# Patient Record
Sex: Male | Born: 1965
Health system: Southern US, Community
[De-identification: ages and names within clinical notes are randomized; demographics above are authoritative.]

## PROBLEM LIST (undated history)

## (undated) DIAGNOSIS — Z86711 Personal history of pulmonary embolism: Secondary | ICD-10-CM

## (undated) DIAGNOSIS — K649 Unspecified hemorrhoids: Secondary | ICD-10-CM

## (undated) DIAGNOSIS — Z9889 Other specified postprocedural states: Secondary | ICD-10-CM

## (undated) DIAGNOSIS — I82409 Acute embolism and thrombosis of unspecified deep veins of unspecified lower extremity: Secondary | ICD-10-CM

## (undated) HISTORY — PX: SHOULDER SURGERY: SHX246

## (undated) HISTORY — DX: Other specified postprocedural states: Z98.890

## (undated) HISTORY — DX: Unspecified hemorrhoids: K64.9

## (undated) HISTORY — DX: Acute embolism and thrombosis of unspecified deep veins of unspecified lower extremity: I82.409

---
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
## 1998-09-03 ENCOUNTER — Emergency Department (HOSPITAL_COMMUNITY): Admission: EM | Admit: 1998-09-03 | Discharge: 1998-09-03 | Payer: Self-pay | Admitting: *Deleted

## 1998-09-06 ENCOUNTER — Emergency Department (HOSPITAL_COMMUNITY): Admission: EM | Admit: 1998-09-06 | Discharge: 1998-09-06 | Payer: Self-pay | Admitting: Emergency Medicine

## 1999-04-29 ENCOUNTER — Emergency Department (HOSPITAL_COMMUNITY): Admission: EM | Admit: 1999-04-29 | Discharge: 1999-04-29 | Payer: Self-pay | Admitting: Emergency Medicine

## 1999-06-24 ENCOUNTER — Encounter: Payer: Self-pay | Admitting: Emergency Medicine

## 1999-06-24 ENCOUNTER — Emergency Department (HOSPITAL_COMMUNITY): Admission: EM | Admit: 1999-06-24 | Discharge: 1999-06-24 | Payer: Self-pay | Admitting: Emergency Medicine

## 1999-06-27 ENCOUNTER — Emergency Department (HOSPITAL_COMMUNITY): Admission: EM | Admit: 1999-06-27 | Discharge: 1999-06-27 | Payer: Self-pay | Admitting: Emergency Medicine

## 1999-07-02 ENCOUNTER — Emergency Department (HOSPITAL_COMMUNITY): Admission: EM | Admit: 1999-07-02 | Discharge: 1999-07-02 | Payer: Self-pay | Admitting: Emergency Medicine

## 2002-08-20 ENCOUNTER — Emergency Department (HOSPITAL_COMMUNITY): Admission: EM | Admit: 2002-08-20 | Discharge: 2002-08-20 | Payer: Self-pay | Admitting: *Deleted

## 2003-02-26 ENCOUNTER — Emergency Department (HOSPITAL_COMMUNITY): Admission: EM | Admit: 2003-02-26 | Discharge: 2003-02-27 | Payer: Self-pay | Admitting: Emergency Medicine

## 2003-02-26 ENCOUNTER — Encounter: Payer: Self-pay | Admitting: *Deleted

## 2004-01-08 ENCOUNTER — Emergency Department (HOSPITAL_COMMUNITY): Admission: EM | Admit: 2004-01-08 | Discharge: 2004-01-08 | Payer: Self-pay | Admitting: Emergency Medicine

## 2004-01-09 ENCOUNTER — Ambulatory Visit (HOSPITAL_COMMUNITY): Admission: RE | Admit: 2004-01-09 | Discharge: 2004-01-09 | Payer: Self-pay | Admitting: Emergency Medicine

## 2005-05-11 ENCOUNTER — Emergency Department (HOSPITAL_COMMUNITY): Admission: EM | Admit: 2005-05-11 | Discharge: 2005-05-11 | Payer: Self-pay | Admitting: Emergency Medicine

## 2006-02-09 ENCOUNTER — Emergency Department (HOSPITAL_COMMUNITY): Admission: EM | Admit: 2006-02-09 | Discharge: 2006-02-09 | Payer: Self-pay | Admitting: Family Medicine

## 2006-12-04 ENCOUNTER — Emergency Department (HOSPITAL_COMMUNITY): Admission: EM | Admit: 2006-12-04 | Discharge: 2006-12-04 | Payer: Self-pay | Admitting: Emergency Medicine

## 2007-01-01 ENCOUNTER — Emergency Department (HOSPITAL_COMMUNITY): Admission: EM | Admit: 2007-01-01 | Discharge: 2007-01-01 | Payer: Self-pay | Admitting: Family Medicine

## 2007-08-07 ENCOUNTER — Emergency Department (HOSPITAL_COMMUNITY): Admission: EM | Admit: 2007-08-07 | Discharge: 2007-08-07 | Payer: Self-pay | Admitting: Emergency Medicine

## 2008-02-16 ENCOUNTER — Encounter (INDEPENDENT_AMBULATORY_CARE_PROVIDER_SITE_OTHER): Payer: Self-pay | Admitting: Nurse Practitioner

## 2008-02-16 ENCOUNTER — Ambulatory Visit: Payer: Self-pay | Admitting: *Deleted

## 2008-02-16 ENCOUNTER — Ambulatory Visit: Payer: Self-pay | Admitting: Internal Medicine

## 2008-02-16 LAB — CONVERTED CEMR LAB
AST: 30 units/L (ref 0–37)
Albumin: 4.2 g/dL (ref 3.5–5.2)
Alkaline Phosphatase: 55 units/L (ref 39–117)
BUN: 13 mg/dL (ref 6–23)
Basophils Absolute: 0 10*3/uL (ref 0.0–0.1)
Basophils Relative: 1 % (ref 0–1)
CO2: 24 meq/L (ref 19–32)
Chloride: 104 meq/L (ref 96–112)
HCT: 48.4 % (ref 39.0–52.0)
HDL: 48 mg/dL (ref >39)
Hemoglobin: 16.2 g/dL (ref 13.0–17.0)
LDL Cholesterol: 129 mg/dL — ABNORMAL HIGH (ref 0–99)
Lymphs Abs: 1.6 10*3/uL (ref 0.7–4.0)
MCV: 94.3 fL (ref 78.0–100.0)
Monocytes Absolute: 0.6 10*3/uL (ref 0.1–1.0)
Monocytes Relative: 10 % (ref 3–12)
Platelets: 202 10*3/uL (ref 150–400)
Potassium: 4.5 meq/L (ref 3.5–5.3)
TSH: 1.259 microintl units/mL (ref 0.350–5.50)
Total CHOL/HDL Ratio: 4.1
Triglycerides: 98 mg/dL (ref <150)
WBC: 5.7 10*3/uL (ref 4.0–10.5)

## 2008-02-17 ENCOUNTER — Ambulatory Visit (HOSPITAL_COMMUNITY): Admission: RE | Admit: 2008-02-17 | Discharge: 2008-02-17 | Payer: Self-pay | Admitting: Internal Medicine

## 2008-09-02 ENCOUNTER — Emergency Department (HOSPITAL_COMMUNITY): Admission: EM | Admit: 2008-09-02 | Discharge: 2008-09-03 | Payer: Self-pay | Admitting: Emergency Medicine

## 2010-10-31 ENCOUNTER — Emergency Department (HOSPITAL_COMMUNITY): Admission: EM | Admit: 2010-10-31 | Discharge: 2010-01-03 | Payer: Self-pay | Admitting: Emergency Medicine

## 2011-02-12 LAB — URINALYSIS, ROUTINE W REFLEX MICROSCOPIC
Nitrite: NEGATIVE
Specific Gravity, Urine: 1.01 (ref 1.005–1.030)

## 2011-09-05 LAB — I-STAT 8, (EC8 V) (CONVERTED LAB)
BUN: 10
Bicarbonate: 21.4
Chloride: 109
Glucose, Bld: 79
Hemoglobin: 17.7 — ABNORMAL HIGH
pCO2, Ven: 39.1 — ABNORMAL LOW
pH, Ven: 7.347 — ABNORMAL HIGH

## 2011-09-05 LAB — CBC
HCT: 47.3
Platelets: 230
RBC: 5.13
WBC: 6.7

## 2011-09-05 LAB — DIFFERENTIAL
Lymphocytes Relative: 29
Lymphs Abs: 1.9
Monocytes Relative: 8
Neutro Abs: 4.2

## 2011-09-05 LAB — SAMPLE TO BLOOD BANK

## 2011-09-05 LAB — POCT I-STAT CREATININE
Creatinine, Ser: 1.1
Operator id: 277751

## 2015-07-29 ENCOUNTER — Emergency Department (HOSPITAL_COMMUNITY)
Admission: EM | Admit: 2015-07-29 | Discharge: 2015-07-29 | Disposition: A | Discharge status: Home / Self Care | Payer: Self-pay | Attending: Emergency Medicine | Admitting: Emergency Medicine

## 2015-07-29 ENCOUNTER — Encounter (HOSPITAL_COMMUNITY): Payer: Self-pay | Admitting: Emergency Medicine

## 2015-07-29 DIAGNOSIS — R42 Dizziness and giddiness: Secondary | ICD-10-CM | POA: Insufficient documentation

## 2015-07-29 DIAGNOSIS — R1033 Periumbilical pain: Secondary | ICD-10-CM | POA: Insufficient documentation

## 2015-07-29 DIAGNOSIS — R109 Unspecified abdominal pain: Secondary | ICD-10-CM

## 2015-07-29 DIAGNOSIS — R197 Diarrhea, unspecified: Secondary | ICD-10-CM | POA: Insufficient documentation

## 2015-07-29 LAB — URINALYSIS, ROUTINE W REFLEX MICROSCOPIC
Bilirubin Urine: NEGATIVE
Glucose, UA: NEGATIVE mg/dL
Ketones, ur: 15 mg/dL — AB
LEUKOCYTES UA: NEGATIVE
NITRITE: NEGATIVE
PH: 5.5 (ref 5.0–8.0)
Protein, ur: 30 mg/dL — AB
SPECIFIC GRAVITY, URINE: 1.024 (ref 1.005–1.030)
Urobilinogen, UA: 0.2 mg/dL (ref 0.0–1.0)

## 2015-07-29 LAB — URINE MICROSCOPIC-ADD ON

## 2015-07-29 LAB — I-STAT CHEM 8, ED
BUN: 8 mg/dL (ref 6–20)
CHLORIDE: 102 mmol/L (ref 101–111)
CREATININE: 1.2 mg/dL (ref 0.61–1.24)
Calcium, Ion: 1.09 mmol/L — ABNORMAL LOW (ref 1.12–1.23)
Glucose, Bld: 134 mg/dL — ABNORMAL HIGH (ref 65–99)
HEMATOCRIT: 55 % — AB (ref 39.0–52.0)
HEMOGLOBIN: 18.7 g/dL — AB (ref 13.0–17.0)
POTASSIUM: 3.6 mmol/L (ref 3.5–5.1)
Sodium: 136 mmol/L (ref 135–145)
TCO2: 21 mmol/L (ref 0–100)

## 2015-07-29 MED ORDER — DICYCLOMINE HCL 20 MG PO TABS: 20.0000 mg | ORAL_TABLET | Freq: Three times a day (TID) | ORAL | Status: DC

## 2015-07-29 MED ORDER — DICYCLOMINE HCL 10 MG PO CAPS
10.0000 mg | ORAL_CAPSULE | Freq: Once | ORAL | Status: AC
  Administered 2015-07-29: 10 mg via ORAL
  Filled 2015-07-29: qty 1

## 2015-07-29 MED ORDER — SODIUM CHLORIDE 0.9 % IV BOLUS (SEPSIS)
2000.0000 mL | Freq: Once | INTRAVENOUS | Status: AC
Start: 2015-07-29 — End: 2015-07-29
  Administered 2015-07-29: 2000 mL via INTRAVENOUS

## 2015-07-29 NOTE — ED Notes (Signed)
Pt from home with reports of lower abd pain x2 days with constant diarrhea. Pt denies any n/v or fevers. States has been taking ibuprofen with no relief. Pt axox4, nad noted.

## 2015-07-29 NOTE — ED Provider Notes (Signed)
CSN: [id number]     Arrival date & time 07/29/15  4098 History   First MD Initiated Contact with Patient 07/29/15 0701     No chief complaint on file.   Chief complaint abdominal pain and diarrhea (Consider location/radiation/quality/duration/timing/severity/associated sxs/prior Treatment) HPI Complains of low crampy abdominal pain, started 6 AM yesterday pain is lower abdomen, nonradiating, crampy last 15 seconds at a time goes away for 15 minutes at a time. Nothing makes pain better or worse. Associated symptoms include 7 episodes of diarrhea since onset of pain, and mild lightheadedness. No nausea or vomiting. He is presently hungry. He admits to subjective fever treated himself with ibuprofen last dose 7 PM yesterday. No recent travel no recent and probiotics No past medical history on file. No past surgical history on file. past mental history negative No family history on file. past surgical history shoulder surgery no history of abdominal surgeries Social History  Substance Use Topics  . Smoking status: Not on file  . Smokeless tobacco: Not on file  . Alcohol Use: Not on file   no tobacco drinks one sixpack per week plus "a couple of shots", no illicit drug use  Review of Systems  Constitutional: Negative.   HENT: Negative.   Respiratory: Negative.   Cardiovascular: Negative.   Gastrointestinal: Positive for abdominal pain and diarrhea.  Musculoskeletal: Negative.   Skin: Negative.   Neurological: Positive for light-headedness.  Psychiatric/Behavioral: Negative.       Allergies  Review of patient's allergies indicates not on file.  Home Medications   Prior to Admission medications   Not on File   There were no vitals taken for this visit. Physical Exam  Constitutional: He appears well-developed and well-nourished. No distress.  HENT:  Head: Normocephalic and atraumatic.  Mucous memory and dry  Eyes: Conjunctivae are normal. Pupils are equal, round, and reactive  to light.  Neck: Neck supple. No tracheal deviation present. No thyromegaly present.  Cardiovascular: Normal rate and regular rhythm.   No murmur heard. Pulmonary/Chest: Effort normal and breath sounds normal.  Abdominal: Soft. Bowel sounds are normal. He exhibits no distension and no mass. There is tenderness. There is no rebound and no guarding.  Minimal tenderness infraumbilical area  Genitourinary: Penis normal.  Scrotum normal  Musculoskeletal: Normal range of motion. He exhibits no edema or tenderness.  Neurological: He is alert. Coordination normal.  Skin: Skin is warm and dry. No rash noted.  Psychiatric: He has a normal mood and affect.  Nursing note and vitals reviewed.   ED Course  Procedures (including critical care time) Labs Review Labs Reviewed - No data to display  Imaging Review No results found. I have personally reviewed and evaluated these images and lab results as part of my medical decision-making.   EKG Interpretation None     9:50 AM complains of crampy abdominal pain lasting 30 seconds at a time. Bentyl ordered  11:20 PM he continues to have crampy abdominal pain lasting approximately 30 seconds at a time. He he does feel ready to go home Results for orders placed or performed during the hospital encounter of 07/29/15  Urinalysis, Routine w reflex microscopic (not at [facility])  Result Value Ref Range   Color, Urine AMBER (A) YELLOW   APPearance CLEAR CLEAR   Specific Gravity, Urine 1.024 1.005 - 1.030   pH 5.5 5.0 - 8.0   Glucose, UA NEGATIVE NEGATIVE mg/dL   Hgb urine dipstick SMALL (A) NEGATIVE   Bilirubin Urine NEGATIVE NEGATIVE  Ketones, ur 15 (A) NEGATIVE mg/dL   Protein, ur 30 (A) NEGATIVE mg/dL   Urobilinogen, UA 0.2 0.0 - 1.0 mg/dL   Nitrite NEGATIVE NEGATIVE   Leukocytes, UA NEGATIVE NEGATIVE  Urine microscopic-add on  Result Value Ref Range   Squamous Epithelial / LPF RARE RARE   RBC / HPF 0-2 <3 RBC/hpf   Urine-Other MUCOUS PRESENT    I-stat chem 8, ed  Result Value Ref Range   Sodium 136 135 - 145 mmol/L   Potassium 3.6 3.5 - 5.1 mmol/L   Chloride 102 101 - 111 mmol/L   BUN 8 6 - 20 mg/dL   Creatinine, Ser 1.61 0.61 - 1.24 mg/dL   Glucose, Bld 096 (H) 65 - 99 mg/dL   Calcium, Ion 0.45 (L) 1.12 - 1.23 mmol/L   TCO2 21 0 - 100 mmol/L   Hemoglobin 18.7 (H) 13.0 - 17.0 g/dL   HCT 40.9 (H) 81.1 - 91.4 %   No results found.  MDM  I suspect enteritis strongly doubt appendicitis with minimal tenderness. Intermittent pain. Diarrhea. 10 Imodium right ear. Prescription Bentyl. Return for 24 hours if not improved. Referral: [facility] and research study a primary care physician Diagnoses #1 abdominal pain #2 mild dehydration #3 diarrhea Final diagnoses:  None        Doug Sou, MD 07/29/15 1119

## 2015-07-29 NOTE — ED Notes (Signed)
MD at bedside. 

## 2015-07-29 NOTE — Discharge Instructions (Signed)
Take Imodium as directed for diarrhea. Take the medication prescribed as needed for crampy abdominal pain. Avoid milk or foods containing milk such as cheese or ice cream or having diarrhea. Return if having significant pain or if her condition worsens within the next 24 hours. Call the [facility] and community wellness center or any of the numbers on the resource guide to get a primary care physician.  Emergency Department Resource Guide 1) Find a Doctor and Pay Out of Pocket Although you won't have to find out who is covered by your insurance plan, it is a good idea to ask around and get recommendations. You will then need to call the office and see if the doctor you have chosen will accept you as a new patient and what types of options they offer for patients who are self-pay. Some doctors offer discounts or will set up payment plans for their patients who do not have insurance, but you will need to ask so you aren't surprised when you get to your appointment.  2) Contact Your Local Health Department Not all health departments have doctors that can see patients for sick visits, but many do, so it is worth a call to see if yours does. If you don't know where your local health department is, you can check in your phone book. The CDC also has a tool to help you locate your state's health department, and many state websites also have listings of all of their local health departments.  3) Find a Walk-in Clinic If your illness is not likely to be very severe or complicated, you may want to try a walk in clinic. These are popping up all over the country in pharmacies, drugstores, and shopping centers. They're usually staffed by nurse practitioners or physician assistants that have been trained to treat common illnesses and complaints. They're usually fairly quick and inexpensive. However, if you have serious medical issues or chronic medical problems, these are probably not your best option.  No Primary  Care Doctor: - Call Health Connect at  [phone] - they can help you locate a primary care doctor that  accepts your insurance, provides certain services, etc. - Physician Referral Service- [phone]  Chronic Pain Problems: Organization         Address  Phone   Notes  [facility]  [phone] Patients need to be referred by their primary care doctor.   Medication Assistance: Organization         Address  Phone   Notes  [facility] [address] [place] [place], [place] [address] [phone] --Must be a resident of [facility] -- Must have NO insurance coverage whatsoever (no Medicaid/ Medicare, etc.) -- The pt. MUST have a primary care doctor that directs their care regularly and follows them in the community   [facility]  [phone]   [employer]  [phone]    Agencies that provide inexpensive medical care: Organization         Address  Phone   Notes  [facility]  [phone]   [facility]    [phone]   [facility] [address] [place], [place] [address] [phone]   [facility] [address] [place], [place] [phone]   Planned Parenthood    [phone]   [facility]    [phone]   Community Health and [facility]  [address], [place] Phone:  [phone], Fax:  [phone] Hours of Operation:  9 am - 6 pm, M-F.  Also accepts Medicaid/Medicare and self-pay.  [facility] for [place] [address] [place], [address], [place] Phone: [phone], Fax: [phone]. Hours of Operation:  8:30 am - 5:30 pm, M-F.  Also accepts Medicaid and self-pay.  [facility] [address], [place] Phone: [phone]   [place], [place], [place] [phone], Ext. [phone] Mondays & Thursdays: 7-9 AM.  First 15 patients are seen on a first  come, first serve basis.    [place] Providers:  Organization         Address  Phone   Notes  [facility] [address], [place] [phone] Also accepts self-pay patients.  [facility] [address], [place]  [phone]   [place], [address], [place] [phone]   [facility] [address], [place] [phone]   Lucianne Lei [address], [place]   [phone] Only accepts [place] Access [place] patients after they have their name applied to their card.   Self-Pay (no insurance) in [facility]:  Organization         Address  Phone   Notes  Sickle Cell Patients, [facility] [place] [phone]   [facility] [place] [phone]   [facility] [place]  [place], [place],  [phone]   [facility]/Dr. Osei-Bonsu  [address] [place] or [address], [place] [phone] Phone number for both [place] and [place] locations is the same.  Urgent Medical and [facility] [address], [place] [phone]   [facility] [address] [place] or [address] [phone]   [facility] [address] [place] [phone], phone; [phone], fax Sees patients 1st and 3rd Saturday of every month.  Must not qualify for public or private insurance (i.e. Medicaid, Medicare, [place] Health Choice, Veterans' Benefits)  Household income should be no more than 200% of the poverty level The clinic cannot treat you if you are pregnant or think you are pregnant  Sexually transmitted diseases are not treated at the clinic.    Dental Care: Organization          Address  Phone  Notes  [facility] Department of [facility] [place] [phone] Accepts children up to age 5 who are enrolled in [place] or [place]; pregnant women with a Medicaid card; and children who have applied for Medicaid or [place] Health Choice, but were declined, whose parents can pay a reduced fee at time of service.  [facility] Department of [facility]  [address], [place] [phone] Accepts children up to age 14 who are enrolled in [place] or [place]; pregnant women with a Medicaid card; and children who have applied for Medicaid or  Health Choice, but were declined, whose parents can pay a reduced fee at time of service.  [facility] Adult Dental Access PROGRAM  [place], [place] [phone]  Patients are seen by appointment only. Walk-ins are not accepted. [place] will see patients 62 years of age and older. Monday - Tuesday (8am-5pm) Most Wednesdays (8:30-5pm) $30 per visit, cash only  [facility] Adult Dental Access PROGRAM  [address], [facility] [phone] Patients are seen by appointment only. Walk-ins are not accepted. [place] will see patients 76 years of age and older. One Wednesday Evening (Monthly: Volunteer Based).  $30 per visit, cash only  [place]  [phone] for adults; Children under age 48, call Graduate Pediatric Dentistry at [phone]. Children aged 49-14, please call [phone] to request a pediatric application.  Dental services are provided in all areas of dental care including fillings, crowns and bridges, complete and partial dentures, implants, gum treatment, root canals, and extractions. Preventive care is also provided. Treatment is provided to both adults and children. Patients are selected via a lottery and there is often a waiting list.   [facility] [address] [place]  [phone] [email or web address]   [facility] [address] [place], [place] [phone], Ext. [phone] Second and Fourth Thursday of each month, opens at 6:30 AM; Clinic ends at 9 AM.  Patients are seen on a first-come first-served basis, and a limited number are seen during each clinic.   [facility]  [address] Hillard Danker [place], [place] [phone]   Eligibility Requirements You must have lived in [place], [place], or [place] counties for at least the last three months.   You cannot be eligible for state or federal sponsored [employer], including [employer], [place], or [employer].   You generally cannot be eligible for healthcare insurance through your employer.    How to apply: Eligibility screenings are held every Tuesday and Wednesday afternoon from 1:00 pm until 4:00 pm. You do not need an appointment for the interview!  [facility] [address] [place], [place]   [place]  [place] Department  [place]  [phone]    Behavioral Health Resources in the Community: Intensive Outpatient Programs Organization         Address  Phone  Notes  [place]. [address] [place], [place] [phone]   [facility] [place] [facility] Outpatient [address], [place], [place]   ADS: Alcohol & Drug S[facility]cs [address] [place], [place]   [place] [address],  [place], [place] or [phone]   Substance Abuse Resources Organization         Address  Phone  Notes  Alcohol and Drug Services  [phone]   [place]  [phone]   The [place]   Chinita Pester  [phone]   Residential & Outpatient Substance Abuse Program  [phone]   Psychological  Services Organization         Address  Phone  Notes  [facility] [place]  [place]  [phone]   [place] [address], [place] or [phone]    Mobile Crisis Teams Organization         Address  Phone  Notes  [facility], Mobile Crisis Care Unit  [phone]   [facility]  [address] [place], [place]   [facility] [address], [place]  [address] [place] [phone]    Self-Help/Support Groups Organization         Address  Phone             Notes  [facility]ental [facility]oc. of [place] - variety of support groups  336- I7437963 Call for more information  Narcotics Anonymous (NA), [facility] [address], [employer] [place]  2 meetings at this location   Statistician         Address  Phone  Notes  [facility] [address],    [place]  [phone]   [facility]  [address] [place] [address], [place], [place] [phone]   [facility] [address] [place], [place] [phone] Admissions: 8am-3pm M-F  [facility] [address],    [place], [place] [phone]   [facility] [address] [place], [place], [place] [phone]   [facility] [address]  [place], [place] [phone]   [facility] - Intensive Outpatient [address] Laurell [address], [place], [place] [phone]   [facility] (Addiction [facility] [address] [place].,  [place], [place] [phone] or 9062431896   [facility] (RT[facility]) [address], [place], [place] [phone] Accepts Medicaid  [facility] [place] [address],  [place] [phone] Substance Abuse/Addiction Treatment   [facility] Organization         Address  Phone  Notes  [facility]  [phone]   Angie Fava, PhD [address] Ervin Knack [place], [place]   [phone] or [phone]   [facility] Behavioral   [address] [place], [place] [phone]   [facility] [address] [place], [place] [phone] Insurance/Medicaid/sponsorship through [facility] and Families [address]                                    [place], [place] [phone] Therapy/tele-psych/case  [facility] [address], [place] [phone]    Dr. Lolly Mustache  [phone]   [facility] [place]  [employer] [facility] Dept. 1) [address], [place] 2) [address], [place] 3)  [address], [place] [phone]  [phone]   [facility] Child Abuse Hotline 7205472719 or [phone] (After Hours)

## 2016-12-02 ENCOUNTER — Ambulatory Visit: Payer: Self-pay

## 2016-12-02 ENCOUNTER — Other Ambulatory Visit: Payer: Self-pay | Admitting: Occupational Medicine

## 2016-12-02 DIAGNOSIS — M25512 Pain in left shoulder: Secondary | ICD-10-CM

## 2017-06-22 ENCOUNTER — Ambulatory Visit (INDEPENDENT_AMBULATORY_CARE_PROVIDER_SITE_OTHER): Payer: Self-pay | Admitting: Orthopaedic Surgery

## 2017-06-23 ENCOUNTER — Telehealth (INDEPENDENT_AMBULATORY_CARE_PROVIDER_SITE_OTHER): Payer: Self-pay

## 2017-06-23 NOTE — Telephone Encounter (Signed)
Received vm from Lupita LeashDonna wanting to know what had to be done to get pt rescheduled for IME with Dr. Cleophas DunkerWhitfield on 06/22/17 because it was scheduled incorrectly. Called her back advising that records would have to be reviewed by Dr. Cleophas DunkerWhitfield to see if we would agree to see the patient and that they can be mailed or faxed to my attn.

## 2017-07-21 ENCOUNTER — Encounter (INDEPENDENT_AMBULATORY_CARE_PROVIDER_SITE_OTHER): Payer: Self-pay | Admitting: Orthopaedic Surgery

## 2017-07-21 ENCOUNTER — Ambulatory Visit (INDEPENDENT_AMBULATORY_CARE_PROVIDER_SITE_OTHER): Payer: Worker's Compensation | Admitting: Orthopaedic Surgery

## 2017-07-21 ENCOUNTER — Encounter (INDEPENDENT_AMBULATORY_CARE_PROVIDER_SITE_OTHER): Payer: Self-pay

## 2017-07-21 ENCOUNTER — Telehealth (INDEPENDENT_AMBULATORY_CARE_PROVIDER_SITE_OTHER): Payer: Self-pay

## 2017-07-21 VITALS — BP 154/99 | HR 77 | Ht 69.0 in | Wt 220.0 lb

## 2017-07-21 DIAGNOSIS — M25512 Pain in left shoulder: Secondary | ICD-10-CM

## 2017-07-21 DIAGNOSIS — G8929 Other chronic pain: Secondary | ICD-10-CM

## 2017-07-21 NOTE — Telephone Encounter (Signed)
Emailed todays IME report to the wc adj per her request

## 2017-07-21 NOTE — Progress Notes (Signed)
Office Visit Note   Patient: Allen Savage           Date of Birth: Aug 17, 1966           MRN: [id number] Visit Date: 07/21/2017              Requested by: No referring provider defined for this encounter. PCP: Patient, No Pcp Per   Assessment & Plan: Visit Diagnoses:  1. Chronic left shoulder pain   Chronic impingement syndrome left shoulder with possible insidious instability.  Plan: [name] has been experiencing symptoms in his left shoulder since January 2018 based on his history and that obtained from his chart. 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 To date he's had a course of physical therapy, two subacromial cortisone injections, time, and over-the-counter medicines. He has not worked since his injury. He continues to have symptoms that obviate his ability to function. See discussion below. My recommendation would be to proceed with an arthroscopic evaluation to include an arthroscopic subacromial decompression. I think the distal clavicle degenerative changes are significant and the distal clavicle resection should be considered. I also would evaluate his shoulder under anesthesia  For any subtle instability. Edema was noted along the anterior inferior glenoid He's had a prior right shoulder arthroscopy for an apparent instability. I don't have any the details of that specific surgery. He has had plenty of time since the injury to rehabilitation the shoulder without much relief.  Follow-Up Instructions: Return if symptoms worsen or fail to improve.   Orders:  No orders of the defined types were placed in this encounter.  No orders of the defined types were placed in this encounter.     Procedures: No procedures performed   Clinical Data: No additional findings.   Subjective: Chief Complaint  Patient presents with  . Left Shoulder - Pain    Allen Savage is here for an IME, left shoulder pain x 8 months. He sustained a shoulder injury at work  Allen Savage is  51 years old and is here for an independent medical evaluation regarding an on-the-job injury to his left nondominant shoulder. 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 Sometime around 12/01/2016 Allen Savage experienced shoulder pain while on the job. Prior to that he had had some "unusual feeling" in his shoulder for approximately 2 months but no pain or compromise of his activities. His discomfort was not significant enough to even consider medical evaluation. The real problem began as above  In early January. After that injury he was seen at the occupational health clinic at [facility] and subsequently referred to [facility] orthopedics. He's been evaluated by Dr. Farris Has and Dr. Margarita Rana. To date he's had a 3 week course of physical therapy, two subacromial cortisone injections and home exercises. He's tried a number of over-the-counter medicines to help him sleep at night. He denies any numbness or tingling but having difficulty raising his arm over his head or performing any rotational activities with his left arm. 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 He's having some pain with overhead activities as well. Never had any prior surgery on the left side. His past history is significant in  that he's had an arthroscopic procedure of his right shoulder many years ago for what he thought might be "instability". Since the surgery he has not had any further problems. Specific symptoms referable to the  left shoulder include popping, clicking, and occasional catching. He is not aware that he's had any obvious instability symptoms. He had an MRI scan without contrast of his left shoulder on 01/27/2017. There was mild appearing supraspinatus and infraspinatus tendinopathy without a tear. A prior MRI scan of his left shoulder performed in 2009 head revealed a small of this surface tear of the supraspinatus. That was not visible on  this study. Biceps long head was intact and normal in appearance. There were mild degenerative changes that had progressed since the scan in 2009. There were mild degenerative changes of the glenohumeral joint with a small focus of subchondral edema on the anterior inferior glenoid area that might be consistent with subtle instability. No tear was identified of the labrum. Although he had a prior scan in 2009 he is not aware of the problem at that time. Any discomfort from 2009 resolved and he wasn't having any significant issues with the shoulder up to the time of his injury in January Allen Savage  Had a peer review report performed on 03/10/2017 which denied the planned surgery of an  arthroscopic distal clavicle resection and subacromial decompression. Allen Savage has not worked since his injury in January and is awaiting further disposition from [employer].  HPI  Review of Systems  Constitutional: Negative for fatigue.  HENT: Negative for hearing loss.   Respiratory: Negative for apnea, chest tightness and shortness of breath.   Cardiovascular: Negative for chest pain, palpitations and leg swelling.  Gastrointestinal: Negative for blood in stool, constipation and diarrhea.  Genitourinary: Negative for difficulty urinating.  Musculoskeletal: Negative for arthralgias, back pain, joint swelling, myalgias, neck pain and neck stiffness.  Neurological: Negative for weakness, numbness and headaches.  Hematological: Does not bruise/bleed easily.  Psychiatric/Behavioral: Negative for sleep disturbance. The patient is not nervous/anxious.      Objective: Vital Signs: BP (!) 154/99   Pulse 77   Ht 5\' 9"  (1.753 m)   Wt 220 lb (99.8 kg)   BMI 32.49 kg/m   Physical Exam  Ortho Exam Silastic is awake alert and oriented 3. Is comfortable in the sitting position. He did have some tenderness over the acromioclavicular joint of his left shoulder. Biceps was intact. Skin was intact. Neurovascular exam  intact. Good grip and good release. No apparent weakness with internal/external rotation of the shoulder but did have some mild discomfort. He was able to place his left arm over his head but had somewhat of a circuitous route based on his pain. Mild positive impingement and empty can testing. Mild subacromial "pop" was palpated. No pain with range of motion of the cervical spine. No distal edema.  Specialty Comments:  No specialty comments available.  Imaging: No results found.   PMFS History: There are no active problems to display for this patient.  No past medical history on file.  No family history on file.  No past surgical history on file. Social History   Occupational History  . Not on file.   Social History Main Topics  . Smoking status: Never Smoker  . Smokeless tobacco: Never Used  . Alcohol use 0.6 oz/week    1 Glasses of wine per week  . Drug use: No  . Sexual activity: Not on file

## 2017-08-10 ENCOUNTER — Telehealth (INDEPENDENT_AMBULATORY_CARE_PROVIDER_SITE_OTHER): Payer: Self-pay

## 2017-08-10 ENCOUNTER — Telehealth (INDEPENDENT_AMBULATORY_CARE_PROVIDER_SITE_OTHER): Payer: Self-pay | Admitting: Orthopaedic Surgery

## 2017-08-10 NOTE — Telephone Encounter (Signed)
Faxed the 07/21/17 office note to the adj per her request

## 2017-08-10 NOTE — Telephone Encounter (Signed)
Per patient WC has not received all info concerning patients IME appt, and is still waiting on a paper. Please send all ov notes, reports from IME appt to adjustor, Lenox Ponds.

## 2017-08-11 NOTE — Telephone Encounter (Signed)
This was emailed for the 3rd time to the adj yesterday as well as faxed. Lenox Ponds @ [employer] did receive. She still needs a causation letter addressed that she states was sent with the original records that were received but was never sent back to me to fax. I asked her to send again yesterday and still have not received.

## 2017-08-14 NOTE — Telephone Encounter (Signed)
Causation letter received and sent to [facility] for Dr. Cleophas Dunker to address

## 2017-08-17 NOTE — Telephone Encounter (Signed)
I have it in the bag to come over today.

## 2017-08-18 NOTE — Telephone Encounter (Signed)
Emailed it to [employer]

## 2018-06-24 DIAGNOSIS — Z86711 Personal history of pulmonary embolism: Secondary | ICD-10-CM | POA: Insufficient documentation

## 2018-06-24 HISTORY — DX: Personal history of pulmonary embolism: Z86.711

## 2018-07-19 ENCOUNTER — Encounter (HOSPITAL_COMMUNITY): Payer: Self-pay | Admitting: Emergency Medicine

## 2018-07-19 ENCOUNTER — Inpatient Hospital Stay (HOSPITAL_COMMUNITY)
Admission: EM | Admit: 2018-07-19 | Discharge: 2018-07-22 | DRG: 176 | Disposition: A | Discharge status: Home / Self Care | Payer: Commercial Managed Care - PPO | Attending: Family Medicine | Admitting: Family Medicine

## 2018-07-19 DIAGNOSIS — I2699 Other pulmonary embolism without acute cor pulmonale: Principal | ICD-10-CM | POA: Diagnosis present

## 2018-07-19 DIAGNOSIS — I5030 Unspecified diastolic (congestive) heart failure: Secondary | ICD-10-CM | POA: Diagnosis not present

## 2018-07-19 DIAGNOSIS — M7989 Other specified soft tissue disorders: Secondary | ICD-10-CM | POA: Diagnosis not present

## 2018-07-19 DIAGNOSIS — I2609 Other pulmonary embolism with acute cor pulmonale: Secondary | ICD-10-CM | POA: Diagnosis not present

## 2018-07-19 DIAGNOSIS — R0689 Other abnormalities of breathing: Secondary | ICD-10-CM | POA: Diagnosis not present

## 2018-07-19 DIAGNOSIS — I82492 Acute embolism and thrombosis of other specified deep vein of left lower extremity: Secondary | ICD-10-CM | POA: Diagnosis present

## 2018-07-19 DIAGNOSIS — I82431 Acute embolism and thrombosis of right popliteal vein: Secondary | ICD-10-CM | POA: Diagnosis not present

## 2018-07-19 DIAGNOSIS — R030 Elevated blood-pressure reading, without diagnosis of hypertension: Secondary | ICD-10-CM | POA: Diagnosis present

## 2018-07-19 DIAGNOSIS — R079 Chest pain, unspecified: Secondary | ICD-10-CM | POA: Diagnosis not present

## 2018-07-19 DIAGNOSIS — R609 Edema, unspecified: Secondary | ICD-10-CM | POA: Diagnosis not present

## 2018-07-19 DIAGNOSIS — R0789 Other chest pain: Secondary | ICD-10-CM | POA: Diagnosis not present

## 2018-07-19 DIAGNOSIS — I82442 Acute embolism and thrombosis of left tibial vein: Secondary | ICD-10-CM | POA: Diagnosis present

## 2018-07-19 DIAGNOSIS — R0602 Shortness of breath: Secondary | ICD-10-CM | POA: Diagnosis not present

## 2018-07-19 NOTE — ED Triage Notes (Signed)
Patient arrived with EMS from home reports right chest pain with SOB worse with deep inspiration , denies nausea or diaphoresis , he received 324 ASA , 1 NTG sl and Morphine 4 mg IV by EMS with no relief , rates pain 10/10 at arrival , pt. added right lower leg pain for 2 days . Pt. drank ETOH this evening .

## 2018-07-20 ENCOUNTER — Encounter (HOSPITAL_COMMUNITY): Payer: Self-pay | Admitting: Internal Medicine

## 2018-07-20 ENCOUNTER — Inpatient Hospital Stay (HOSPITAL_COMMUNITY): Payer: Commercial Managed Care - PPO

## 2018-07-20 ENCOUNTER — Emergency Department (HOSPITAL_COMMUNITY): Payer: Commercial Managed Care - PPO

## 2018-07-20 ENCOUNTER — Observation Stay (HOSPITAL_COMMUNITY): Payer: Commercial Managed Care - PPO

## 2018-07-20 DIAGNOSIS — R079 Chest pain, unspecified: Secondary | ICD-10-CM | POA: Diagnosis present

## 2018-07-20 DIAGNOSIS — I82431 Acute embolism and thrombosis of right popliteal vein: Secondary | ICD-10-CM | POA: Diagnosis present

## 2018-07-20 DIAGNOSIS — R0602 Shortness of breath: Secondary | ICD-10-CM | POA: Diagnosis not present

## 2018-07-20 DIAGNOSIS — M7989 Other specified soft tissue disorders: Secondary | ICD-10-CM

## 2018-07-20 DIAGNOSIS — I82442 Acute embolism and thrombosis of left tibial vein: Secondary | ICD-10-CM | POA: Diagnosis present

## 2018-07-20 DIAGNOSIS — I2699 Other pulmonary embolism without acute cor pulmonale: Secondary | ICD-10-CM | POA: Diagnosis present

## 2018-07-20 DIAGNOSIS — I2609 Other pulmonary embolism with acute cor pulmonale: Secondary | ICD-10-CM | POA: Diagnosis not present

## 2018-07-20 DIAGNOSIS — R609 Edema, unspecified: Secondary | ICD-10-CM | POA: Diagnosis not present

## 2018-07-20 DIAGNOSIS — I5030 Unspecified diastolic (congestive) heart failure: Secondary | ICD-10-CM | POA: Diagnosis not present

## 2018-07-20 DIAGNOSIS — R030 Elevated blood-pressure reading, without diagnosis of hypertension: Secondary | ICD-10-CM | POA: Diagnosis present

## 2018-07-20 DIAGNOSIS — I82492 Acute embolism and thrombosis of other specified deep vein of left lower extremity: Secondary | ICD-10-CM | POA: Diagnosis present

## 2018-07-20 LAB — CBC WITH DIFFERENTIAL/PLATELET
ABS IMMATURE GRANULOCYTES: 0 10*3/uL (ref 0.0–0.1)
BASOS ABS: 0 10*3/uL (ref 0.0–0.1)
Basophils Relative: 0 %
Eosinophils Absolute: 0 10*3/uL (ref 0.0–0.7)
Eosinophils Relative: 0 %
HCT: 51.9 % (ref 39.0–52.0)
HEMOGLOBIN: 17.1 g/dL — AB (ref 13.0–17.0)
Immature Granulocytes: 1 %
LYMPHS ABS: 1.9 10*3/uL (ref 0.7–4.0)
LYMPHS PCT: 25 %
MCH: 30.9 pg (ref 26.0–34.0)
MCHC: 32.9 g/dL (ref 30.0–36.0)
MCV: 93.7 fL (ref 78.0–100.0)
Monocytes Absolute: 0.9 10*3/uL (ref 0.1–1.0)
Monocytes Relative: 12 %
NEUTROS ABS: 4.7 10*3/uL (ref 1.7–7.7)
Neutrophils Relative %: 62 %
Platelets: 183 10*3/uL (ref 150–400)
RBC: 5.54 MIL/uL (ref 4.22–5.81)
RDW: 16.1 % — ABNORMAL HIGH (ref 11.5–15.5)
WBC: 7.6 10*3/uL (ref 4.0–10.5)

## 2018-07-20 LAB — HEPARIN LEVEL (UNFRACTIONATED)
Heparin Unfractionated: 0.42 IU/mL (ref 0.30–0.70)
Heparin Unfractionated: 0.33 IU/mL (ref 0.30–0.70)

## 2018-07-20 LAB — COMPREHENSIVE METABOLIC PANEL
ALBUMIN: 3.1 g/dL — AB (ref 3.5–5.0)
ALK PHOS: 84 U/L (ref 38–126)
ALT: 13 U/L (ref 0–44)
AST: 21 U/L (ref 15–41)
Anion gap: 14 (ref 5–15)
BUN: 7 mg/dL (ref 6–20)
CALCIUM: 9.3 mg/dL (ref 8.9–10.3)
CO2: 23 mmol/L (ref 22–32)
CREATININE: 0.77 mg/dL (ref 0.61–1.24)
Chloride: 103 mmol/L (ref 98–111)
GFR calc Af Amer: >60 mL/min (ref >60)
GFR calc non Af Amer: >60 mL/min (ref >60)
GLUCOSE: 93 mg/dL (ref 70–99)
Potassium: 4.1 mmol/L (ref 3.5–5.1)
SODIUM: 140 mmol/L (ref 135–145)
Total Bilirubin: 0.8 mg/dL (ref 0.3–1.2)
Total Protein: 6.3 g/dL — ABNORMAL LOW (ref 6.5–8.1)

## 2018-07-20 LAB — ECHOCARDIOGRAM COMPLETE
Height: 69 in
Weight: 3360 oz

## 2018-07-20 LAB — ANTITHROMBIN III: AntiThromb III Func: 61 % — ABNORMAL LOW (ref 75–120)

## 2018-07-20 LAB — D-DIMER, QUANTITATIVE: D-Dimer, Quant: 6.74 ug/mL-FEU — ABNORMAL HIGH (ref 0.00–0.50)

## 2018-07-20 LAB — I-STAT TROPONIN, ED: Troponin i, poc: 0.02 ng/mL (ref 0.00–0.08)

## 2018-07-20 LAB — LIPASE, BLOOD: LIPASE: 47 U/L (ref 11–51)

## 2018-07-20 LAB — HIV ANTIBODY (ROUTINE TESTING W REFLEX): HIV SCREEN 4TH GENERATION: NONREACTIVE

## 2018-07-20 MED ORDER — TRAMADOL HCL 50 MG PO TABS
50.0000 mg | ORAL_TABLET | Freq: Four times a day (QID) | ORAL | Status: DC | PRN
  Administered 2018-07-20 – 2018-07-21 (×3): 50 mg via ORAL
  Filled 2018-07-20 (×3): qty 1

## 2018-07-20 MED ORDER — IOPAMIDOL (ISOVUE-370) INJECTION 76%
INTRAVENOUS | Status: AC
  Filled 2018-07-20: qty 100

## 2018-07-20 MED ORDER — ACETAMINOPHEN 325 MG PO TABS: 650.0000 mg | ORAL_TABLET | Freq: Four times a day (QID) | ORAL | Status: DC | PRN

## 2018-07-20 MED ORDER — ONDANSETRON HCL 4 MG/2ML IJ SOLN
4.0000 mg | Freq: Once | INTRAMUSCULAR | Status: AC
  Administered 2018-07-20: 4 mg via INTRAVENOUS
  Filled 2018-07-20: qty 2

## 2018-07-20 MED ORDER — OXYCODONE HCL 5 MG PO TABS: 5.0000 mg | ORAL_TABLET | ORAL | Status: DC | PRN

## 2018-07-20 MED ORDER — HYDROMORPHONE HCL 1 MG/ML IJ SOLN
1.0000 mg | Freq: Once | INTRAMUSCULAR | Status: AC
  Administered 2018-07-20: 1 mg via INTRAVENOUS
  Filled 2018-07-20: qty 1

## 2018-07-20 MED ORDER — ONDANSETRON HCL 4 MG PO TABS: 4.0000 mg | ORAL_TABLET | Freq: Four times a day (QID) | ORAL | Status: DC | PRN

## 2018-07-20 MED ORDER — ONDANSETRON HCL 4 MG/2ML IJ SOLN: 4.0000 mg | Freq: Four times a day (QID) | INTRAMUSCULAR | Status: DC | PRN

## 2018-07-20 MED ORDER — HEPARIN (PORCINE) IN NACL 100-0.45 UNIT/ML-% IJ SOLN
1850.0000 [IU]/h | INTRAMUSCULAR | Status: DC
  Administered 2018-07-20: 1750 [IU]/h via INTRAVENOUS
  Administered 2018-07-20: 1650 [IU]/h via INTRAVENOUS
  Administered 2018-07-21: 1850 [IU]/h via INTRAVENOUS
  Filled 2018-07-20 (×3): qty 250

## 2018-07-20 MED ORDER — ACETAMINOPHEN 650 MG RE SUPP: 650.0000 mg | Freq: Four times a day (QID) | RECTAL | Status: DC | PRN

## 2018-07-20 MED ORDER — ALBUTEROL SULFATE (2.5 MG/3ML) 0.083% IN NEBU: 2.5000 mg | INHALATION_SOLUTION | RESPIRATORY_TRACT | Status: DC | PRN

## 2018-07-20 MED ORDER — HEPARIN BOLUS VIA INFUSION
6000.0000 [IU] | Freq: Once | INTRAVENOUS | Status: AC
  Administered 2018-07-20: 6000 [IU] via INTRAVENOUS
  Filled 2018-07-20: qty 6000

## 2018-07-20 MED ORDER — IOPAMIDOL (ISOVUE-370) INJECTION 76%
100.0000 mL | Freq: Once | INTRAVENOUS | Status: AC | PRN
  Administered 2018-07-20: 100 mL via INTRAVENOUS

## 2018-07-20 NOTE — Progress Notes (Signed)
ANTICOAGULATION CONSULT NOTE - Initial Consult  Pharmacy Consult for heparin Indication: pulmonary embolus  No Known Allergies  Patient Measurements: Height: 5\' 9"  (175.3 cm) Weight: 210 lb (95.3 kg) IBW/kg (Calculated) : 70.7 Heparin Dosing Weight: 90.4 kg  Vital Signs: Temp: 98.6 F (37 C) (08/26 2353) Temp Source: Oral (08/26 2353) BP: 123/77 (08/27 0346) Pulse Rate: 92 (08/27 0346)  Labs: Recent Labs    07/20/18 0011  HGB 17.1*  HCT 51.9  PLT 183  CREATININE 0.77    Estimated Creatinine Clearance: 127.2 mL/min (by C-G formula based on SCr of 0.77 mg/dL).   Medical History: History reviewed. No pertinent past medical history.  Medications:  See medication history  Assessment: 52 yo man to start heparin for PE.  He was not on anticoagulation PTA.  Baseline Hg 17.1, PTLC 1873.  CT with evidence of right heart strain. Goal of Therapy:  Heparin level 0.3-0.7 units/ml Monitor platelets by anticoagulation protocol: Yes   Plan:  Heparin 6000 unit bolus and drip at 1650 units/hr Check heparin level 6 hours after start Daily HL and CBC while on heparin Monitor for bleeding complications  50 Liann Spaeth [place] 07/20/2018,4:22 AM

## 2018-07-20 NOTE — Progress Notes (Signed)
CM will provide copay assist card to help with cost of Eliqius- will f/u up with pt prior to discharge.

## 2018-07-20 NOTE — H&P (Addendum)
History and Physical    Allen Savage:[id number] DOB: [birth year] DOA: 07/19/2018  Referring MD/NP/PA: Antony Madura, PA-C PCP: Patient, No Pcp Per  Patient coming from: Home via EMS  Chief Complaint: Chest pain  I have personally briefly reviewed patient's old medical records in [facility]   HPI: Allen Savage is a 52 y.o. male without past medical history; who presented to the emergency department with complaints of chest pain.  Symptoms started last night around 9 PM.   Pain noted most significantly on right side of the chest and worsened with taking inspiratory breath.  He noted some shortness of breath.  Associated symptoms include right leg pain and swelling which he reports present for at least 1 week.  He does not have a regular primary care provider with whom he follows.  Denies any hemoptysis, palpitations, loss of consciousness, fever, chills, nausea, vomiting, abdominal pain, dysuria, recent history of surgery, or family history of clotting disorder.  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   ED Course: Patient was found to have bilateral pulmonary emboli with signs of right heart strain on CT angiogram of the chest. He was started on a heparin drip per pharmacy.  TRH called to admit.  Review of Systems  Constitutional: Negative for chills and fever.  HENT: Negative for congestion and ear discharge.   Eyes: Negative for photophobia and pain.  Respiratory: Positive for shortness of breath.   Cardiovascular: Positive for chest pain and leg swelling.  Gastrointestinal: Negative for abdominal pain, nausea and vomiting.  Genitourinary: Negative for dysuria and frequency.  Musculoskeletal: Positive for myalgias.  Skin: Negative for itching and rash.  Neurological: Negative for focal weakness and loss of consciousness.  Endo/Heme/Allergies: Negative for polydipsia. Does not bruise/bleed easily.  Psychiatric/Behavioral: Negative for  suicidal ideas. The patient is not nervous/anxious.     History reviewed. No pertinent past medical history.  History reviewed. No pertinent surgical history.   reports that he has never smoked. He has never used smokeless tobacco. He reports that he drinks about 1.0 standard drinks of alcohol per week. He reports that he does not use drugs.  No Known Allergies  Family History  Problem Relation Age of Onset  . Diabetes Father     Prior to Admission medications   Medication Sig Start Date End Date Taking? Authorizing Provider  dicyclomine (BENTYL) 20 MG tablet Take 1 tablet (20 mg total) by mouth 4 (four) times daily -  before meals and at bedtime. As needed for abdominal cramps Patient not taking: Reported on 07/20/2018 07/29/15   Doug Sou, MD    Physical Exam:  Constitutional: NAD, calm, comfortable Vitals:   07/20/18 0346 07/20/18 0400 07/20/18 0415 07/20/18 0430  BP: 123/77 127/79 126/86 138/85  Pulse: 92 94 96 100  Resp: 18 (!) 22 (!) 25 (!) 28  Temp:      TempSrc:      SpO2: 99% 96% 98% 96%  Weight:      Height:       Eyes: PERRL, lids and conjunctivae normal ENMT: Mucous membranes are moist. Posterior pharynx clear of any exudate or lesions.   Neck: normal, supple, no masses, no thyromegaly Respiratory: clear to auscultation bilaterally, no wheezing, no crackles. Normal respiratory effort. No accessory muscle use.  Cardiovascular: Regular rate and rhythm, no murmurs / rubs / gallops. No extremity edema. 2+ pedal pulses. No carotid bruits.  Abdomen: no tenderness, no masses  palpated. No hepatosplenomegaly. Bowel sounds positive.  Musculoskeletal: no clubbing / cyanosis. No joint deformity upper and lower extremities.  Some tenderness to palpation of the posterior aspect of the right calf Skin: no rashes, lesions, ulcers. No induration Neurologic: CN 2-12 grossly intact. Sensation intact, DTR normal. Strength 5/5 in all 4.  Psychiatric: Normal judgment and  insight. Alert and oriented x 3. Normal mood.     Labs on Admission: I have personally reviewed following labs and imaging studies  CBC: Recent Labs  Lab 07/20/18 0011  WBC 7.6  NEUTROABS 4.7  HGB 17.1*  HCT 51.9  MCV 93.7  PLT 183   Basic Metabolic Panel: Recent Labs  Lab 07/20/18 0011  NA 140  K 4.1  CL 103  CO2 23  GLUCOSE 93  BUN 7  CREATININE 0.77  CALCIUM 9.3   GFR: Estimated Creatinine Clearance: 127.2 mL/min (by C-G formula based on SCr of 0.77 mg/dL). Liver Function Tests: Recent Labs  Lab 07/20/18 0011  AST 21  ALT 13  ALKPHOS 84  BILITOT 0.8  PROT 6.3*  ALBUMIN 3.1*   Recent Labs  Lab 07/20/18 0011  LIPASE 47   No results for input(s): AMMONIA in the last 168 hours. Coagulation Profile: No results for input(s): INR, PROTIME in the last 168 hours. Cardiac Enzymes: No results for input(s): CKTOTAL, CKMB, CKMBINDEX, TROPONINI in the last 168 hours. BNP (last 3 results) No results for input(s): PROBNP in the last 8760 hours. HbA1C: No results for input(s): HGBA1C in the last 72 hours. CBG: No results for input(s): GLUCAP in the last 168 hours. Lipid Profile: No results for input(s): CHOL, HDL, LDLCALC, TRIG, CHOLHDL, LDLDIRECT in the last 72 hours. Thyroid Function Tests: No results for input(s): TSH, T4TOTAL, FREET4, T3FREE, THYROIDAB in the last 72 hours. Anemia Panel: No results for input(s): VITAMINB12, FOLATE, FERRITIN, TIBC, IRON, RETICCTPCT in the last 72 hours. Urine analysis:    Component Value Date/Time   COLORURINE AMBER (A) 07/29/2015 0814   APPEARANCEUR CLEAR 07/29/2015 0814   LABSPEC 1.024 07/29/2015 0814   PHURINE 5.5 07/29/2015 0814   GLUCOSEU NEGATIVE 07/29/2015 0814   HGBUR SMALL (A) 07/29/2015 0814   BILIRUBINUR NEGATIVE 07/29/2015 0814   KETONESUR 15 (A) 07/29/2015 0814   PROTEINUR 30 (A) 07/29/2015 0814   UROBILINOGEN 0.2 07/29/2015 0814   NITRITE NEGATIVE 07/29/2015 0814   LEUKOCYTESUR NEGATIVE 07/29/2015 0814    Sepsis Labs: No results found for this or any previous visit (from the past 240 hour(s)).   Radiological Exams on Admission: Dg Chest 2 View  Result Date: 07/20/2018 CLINICAL DATA:  52 year old male with chest pain and shortness of breath. EXAM: CHEST - 2 VIEW COMPARISON:  None. FINDINGS: There is minimal indentation of the right hemidiaphragm. The lungs are clear. No pleural effusion or pneumothorax. The cardiac silhouette is within normal limits. No acute osseous pathology. IMPRESSION: No active cardiopulmonary disease. Electronically Signed   By: Elgie Collard M.D.   On: 07/20/2018 00:35   Ct Angio Chest Pe W And/or Wo Contrast  Result Date: 07/20/2018 CLINICAL DATA:  52 year old male with chest pain. Concern for pulmonary embolism. EXAM: CT ANGIOGRAPHY CHEST WITH CONTRAST TECHNIQUE: Multidetector CT imaging of the chest was performed using the standard protocol during bolus administration of intravenous contrast. Multiplanar CT image reconstructions and MIPs were obtained to evaluate the vascular anatomy. CONTRAST:  <See Chart> ISOVUE-370 IOPAMIDOL (ISOVUE-370) INJECTION 76% COMPARISON:  Chest radiograph dated 07/20/2018 FINDINGS: Cardiovascular: There is no cardiomegaly or pericardial effusion. The  thoracic aorta is unremarkable. The origins of the great vessels of the aortic arch are patent. There is a large right pulmonary artery embolus involving the lobar branches of the middle and lower lobes extending into the segmental and subsegmental branches. Left lower lobe segmental pulmonary artery emboli noted. The right ventricle measures 4.2 cm in diameter and the left ventricle measures 4.6 cm in diameter (series 7 image 235). Mediastinum/Nodes: No hilar or mediastinal adenopathy. Esophagus and the thyroid gland are grossly unremarkable. No mediastinal fluid collection. Lungs/Pleura: There are bibasilar atelectatic changes. Focal area of hazy and nodular density in the right middle lobe may  represent developing infarct. There is a 3 mm right middle lobe pulmonary nodule (series 6, image 69). There is no pleural effusion or pneumothorax. The central airways are patent. Upper Abdomen: No acute abnormality. Musculoskeletal: No chest wall abnormality. No acute or significant osseous findings. Review of the MIP images confirms the above findings. IMPRESSION: Bilateral pulmonary artery emboli larger and occlusive on the right. The RV/LV ratio is 0.9. Positive for acute PE with CT evidence of right heart strain (RV/LV Ratio = 0.9) consistent with at least submassive (intermediate risk) PE. The presence of right heart strain has been associated with an increased risk of morbidity and mortality. Please activate Code PE by paging [phone]. These results were called by telephone at the time of interpretation on 07/20/2018 at 4:01 am to Dr. Allison QuarryHoeton, who verbally acknowledged these results. Electronically Signed   By: Elgie CollardArash  Radparvar M.D.   On: 07/20/2018 04:02    EKG: Independently reviewed.  Sinus rhythm at 96 bpm with ST wave changes noted in the inferior leads  Assessment/Plan Pulmonary emboli: Acute.  Patient presents with complaints of chest pain shortness of breath.  CT angiogram reveals bilateral PEs with signs of right heart strain.  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 - Admit to a telemetry bed - Continuous pulse oximetry with nasal cannula oxygen as needed - Heparin drip per pharmacy - Check Echocardiogram and doppler duplex of the lower extremity. - Patient in need of a primary care provider - Determine best oral anticoagulation agent  Alcohol use: Patient reports that the drinking alcohol anywhere from 1-3 beers.  Denies any history of withdrawals. - Monitor for need of placement on CIWA protocols  DVT prophylaxis: heparin Code Status: Full Family Communication: Discussed plan of care with the patient family present bedside Disposition Plan:  [place] discharge home in 1 to 2 days Consults called: None Admission status: observation  Clydie Braunondell A Ciro Tashiro MD [facility] Pager [phone]   If 7PM-7AM, please contact night-coverage [email or web address] Password [email or web address]  07/20/2018, 4:44 AM

## 2018-07-20 NOTE — Care Management (Signed)
07-20-18   BENEFITS CHECK :  # 1   S/W PRECOUS  @ [employer] [facility] # [phone]   ELIQUIS  5 MG BID COVER- YES CO-PAY- $ 449.64  Q/L TWO PILL PER DAY TIER- 2 DRUG PRIOR APPROVAL- NO  DEDUCTIBLE : NOT MET  PREFERRED PHARMACY :YES [facility]  AND [facility]

## 2018-07-20 NOTE — ED Provider Notes (Signed)
[facility] EMERGENCY DEPARTMENT Provider Note   CSN: [id number] Arrival date & time: 07/19/18  2350     History   Chief Complaint Chief Complaint  Patient presents with  . Chest Pain    HPI Allen Savage is a 52 y.o. male.   52 y/o male with no significant PMH presents to the emergency department for complaints of chest pain.  States pain is primarily to the right chest wall.  It is aggravated with deep breathing.  He has had some slight shortness of breath as well as dizziness associated with onset of his symptoms tonight.  Was given 324mg  ASA and 1 SL nitroglycerin without relief of symptoms.  Also given 4 mg IV morphine by EMS en route.  States pain began around 2100, after eating dinner.  No N/V, syncope.  Has been experiencing some preceding RLE/calf pain; worse with ambulation.  Feels his RLE is subjectively more swollen.  Hx of shoulder surgery in November 2018, otherwise no recent surgeries or hospitalizations.  No prolonged travel.  Denies hx of ACS, DVT/PE, DM, HTN, HLD. No FHx of ACS, per patient.  Patient is a city [employer].     History reviewed. No pertinent past medical history.  There are no active problems to display for this patient.   History reviewed. No pertinent surgical history.      Home Medications    Prior to Admission medications   Medication Sig Start Date End Date Taking? Authorizing Provider  dicyclomine (BENTYL) 20 MG tablet Take 1 tablet (20 mg total) by mouth 4 (four) times daily -  before meals and at bedtime. As needed for abdominal cramps Patient not taking: Reported on 07/20/2018 07/29/15   Doug Sou, MD    Family History No family history on file.  Social History Social History   Tobacco Use  . Smoking status: Never Smoker  . Smokeless tobacco: Never Used  Substance Use Topics  . Alcohol use: Yes    Alcohol/week: 1.0 standard drinks    Types: 1 Glasses of wine per week  . Drug use: No      Allergies   Patient has no known allergies.   Review of Systems Review of Systems Ten systems reviewed and are negative for acute change, except as noted in the HPI.    Physical Exam Updated Vital Signs BP 123/77 (BP Location: Right Arm)   Pulse 92   Temp 98.6 F (37 C) (Oral)   Resp 18   Ht 5\' 9"  (1.753 m)   Wt 95.3 kg   SpO2 99%   BMI 31.01 kg/m   Physical Exam  Constitutional: He is oriented to person, place, and time. He appears well-developed and well-nourished. No distress.  Nontoxic appearing and in NAD  HENT:  Head: Normocephalic and atraumatic.  Eyes: Conjunctivae and EOM are normal. No scleral icterus.  Neck: Normal range of motion.  Cardiovascular: Normal rate, regular rhythm and intact distal pulses.  Pulmonary/Chest: Effort normal. No stridor. No respiratory distress. He has no wheezes.  Slight splinting 2/2 pain. Chest expansion is symmetric. Lungs CTAB.  Musculoskeletal: Normal range of motion.  No significant edema in BLE. Some TTP to posterior R calf. No palpable cords.  Neurological: He is alert and oriented to person, place, and time. He exhibits normal muscle tone. Coordination normal.  GCS 15. Patient moving all extremities.  Skin: Skin is warm and dry. No rash noted. He is not diaphoretic. No erythema. No pallor.  Psychiatric: He  has a normal mood and affect. His behavior is normal.  Nursing note and vitals reviewed.    ED Treatments / Results  Labs (all labs ordered are listed, but only abnormal results are displayed) Labs Reviewed  CBC WITH DIFFERENTIAL/PLATELET - Abnormal; Notable for the following components:      Result Value   Hemoglobin 17.1 (*)    RDW 16.1 (*)    All other components within normal limits  COMPREHENSIVE METABOLIC PANEL - Abnormal; Notable for the following components:   Total Protein 6.3 (*)    Albumin 3.1 (*)    All other components within normal limits  D-DIMER, QUANTITATIVE (NOT AT [facility]) - Abnormal;  Notable for the following components:   D-Dimer, Quant 6.74 (*)    All other components within normal limits  LIPASE, BLOOD  ANTITHROMBIN III  PROTEIN C ACTIVITY  PROTEIN C, TOTAL  PROTEIN S ACTIVITY  PROTEIN S, TOTAL  LUPUS ANTICOAGULANT PANEL  BETA-2-GLYCOPROTEIN I ABS, IGG/M/A  HOMOCYSTEINE  FACTOR 5 LEIDEN  PROTHROMBIN GENE MUTATION  CARDIOLIPIN ANTIBODIES, IGG, IGM, IGA  HEPARIN LEVEL (UNFRACTIONATED)  I-STAT TROPONIN, ED    EKG EKG Interpretation  Date/Time:  Monday July 19 2018 23:53:21 EDT Ventricular Rate:  96 PR Interval:    QRS Duration: 86 QT Interval:  351 QTC Calculation: 444 R Axis:   63 Text Interpretation:  Sinus rhythm Probable left atrial enlargement Probable anteroseptal infarct, old Borderline ST elevation, inferior leads Baseline wander Confirmed by Ross Marcus ([id number]) on 07/20/2018 12:06:13 AM   Radiology Dg Chest 2 View  Result Date: 07/20/2018 CLINICAL DATA:  52 year old male with chest pain and shortness of breath. EXAM: CHEST - 2 VIEW COMPARISON:  None. FINDINGS: There is minimal indentation of the right hemidiaphragm. The lungs are clear. No pleural effusion or pneumothorax. The cardiac silhouette is within normal limits. No acute osseous pathology. IMPRESSION: No active cardiopulmonary disease. Electronically Signed   By: Elgie Collard M.D.   On: 07/20/2018 00:35   Ct Angio Chest Pe W And/or Wo Contrast  Result Date: 07/20/2018 CLINICAL DATA:  52 year old male with chest pain. Concern for pulmonary embolism. EXAM: CT ANGIOGRAPHY CHEST WITH CONTRAST TECHNIQUE: Multidetector CT imaging of the chest was performed using the standard protocol during bolus administration of intravenous contrast. Multiplanar CT image reconstructions and MIPs were obtained to evaluate the vascular anatomy. CONTRAST:  <See Chart> ISOVUE-370 IOPAMIDOL (ISOVUE-370) INJECTION 76% COMPARISON:  Chest radiograph dated 07/20/2018 FINDINGS: Cardiovascular: There is no  cardiomegaly or pericardial effusion. The thoracic aorta is unremarkable. The origins of the great vessels of the aortic arch are patent. There is a large right pulmonary artery embolus involving the lobar branches of the middle and lower lobes extending into the segmental and subsegmental branches. Left lower lobe segmental pulmonary artery emboli noted. The right ventricle measures 4.2 cm in diameter and the left ventricle measures 4.6 cm in diameter (series 7 image 235). Mediastinum/Nodes: No hilar or mediastinal adenopathy. Esophagus and the thyroid gland are grossly unremarkable. No mediastinal fluid collection. Lungs/Pleura: There are bibasilar atelectatic changes. Focal area of hazy and nodular density in the right middle lobe may represent developing infarct. There is a 3 mm right middle lobe pulmonary nodule (series 6, image 69). There is no pleural effusion or pneumothorax. The central airways are patent. Upper Abdomen: No acute abnormality. Musculoskeletal: No chest wall abnormality. No acute or significant osseous findings. Review of the MIP images confirms the above findings. IMPRESSION: Bilateral pulmonary artery emboli larger and occlusive on the  right. The RV/LV ratio is 0.9. Positive for acute PE with CT evidence of right heart strain (RV/LV Ratio = 0.9) consistent with at least submassive (intermediate risk) PE. The presence of right heart strain has been associated with an increased risk of morbidity and mortality. Please activate Code PE by paging [phone]. These results were called by telephone at the time of interpretation on 07/20/2018 at 4:01 am to Dr. Allison QuarryHoeton, who verbally acknowledged these results. Electronically Signed   By: Elgie CollardArash  Radparvar M.D.   On: 07/20/2018 04:02    Procedures Procedures (including critical care time)  Medications Ordered in ED Medications  heparin bolus via infusion 6,000 Units (has no administration in time range)  heparin ADULT infusion 100 units/mL  (25000 units/27150mL sodium chloride 0.45%) (has no administration in time range)  HYDROmorphone (DILAUDID) injection 1 mg (1 mg Intravenous Given 07/20/18 0303)  ondansetron (ZOFRAN) injection 4 mg (4 mg Intravenous Given 07/20/18 0304)  iopamidol (ISOVUE-370) 76 % injection 100 mL (100 mLs Intravenous Contrast Given 07/20/18 0327)     4:27 AM Case discussed with Dr. Arsenio LoaderSommer of PCCM. Believes patient is stable for SDU; PCCM happy to consult if patient acutely decompensates or if there are concerning findings to the RV on 2-D echo.  Will consult hospitalist for admission.   CRITICAL CARE Performed by: Antony MaduraKelly Dyllan Kats   Total critical care time: 35 minutes  Critical care time was exclusive of separately billable procedures and treating other patients.  Critical care was necessary to treat or prevent imminent or life-threatening deterioration.  Critical care was time spent personally by me on the following activities: development of treatment plan with patient and/or surrogate as well as nursing, discussions with consultants, evaluation of patient's response to treatment, examination of patient, obtaining history from patient or surrogate, ordering and performing treatments and interventions, ordering and review of laboratory studies, ordering and review of radiographic studies, pulse oximetry and re-evaluation of patient's condition.   Initial Impression / Assessment and Plan / ED Course  I have reviewed the triage vital signs and the nursing notes.  Pertinent labs & imaging results that were available during my care of the patient were reviewed by me and considered in my medical decision making (see chart for details).     52 year old male presents to the emergency department for evaluation of chest pain and shortness of breath.  Symptoms began this evening shortly after dinner.  Had no relief with aspirin, nitroglycerin, morphine prior to arrival.  Work-up today reveals bilateral acute  pulmonary emboli.  These are worse on the right and CT with evidence of right heart strain.  He is currently saturating well on room air.  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  Plan for admission to stepdown ICU.  Will initiate IV heparin.  [facility] to admit.   Final Clinical Impressions(s) / ED Diagnoses   Final diagnoses:  Acute pulmonary embolism with acute cor pulmonale, unspecified pulmonary embolism type [facility](HCC)    ED Discharge Orders    None       Antony MaduraHumes, Jarel Cuadra, PA-C 07/20/18 0434    Shon BatonHorton, Courtney F, MD 07/21/18 [phone]

## 2018-07-20 NOTE — Progress Notes (Signed)
*  Preliminary Results* Right lower extremity venous duplex completed. Right lower extremity is postitive for deep vein thrombosis in the popliteal vein, ptv, and pero veins. There is no evidence of right Baker's cyst.  07/20/2018 3:05 PM  Allen MilletMegan Clare Gandyiddle

## 2018-07-20 NOTE — Progress Notes (Signed)
ANTICOAGULATION CONSULT NOTE Pharmacy Consult for heparin Indication: pulmonary embolus  No Known Allergies  Patient Measurements: Height: 5\' 9"  (175.3 cm) Weight: 210 lb (95.3 kg) IBW/kg (Calculated) : 70.7 Heparin Dosing Weight: 90.4 kg  Vital Signs: Temp: 98.7 F (37.1 C) (08/27 2102) Temp Source: Oral (08/27 2102) BP: 150/93 (08/27 2102) Pulse Rate: 87 (08/27 2102)  Labs: Recent Labs    07/20/18 0011 07/20/18 1146 07/20/18 2000  HGB 17.1*  --   --   HCT 51.9  --   --   PLT 183  --   --   HEPARINUNFRC  --  0.42 0.33  CREATININE 0.77  --   --     Estimated Creatinine Clearance: 127.2 mL/min (by C-G formula based on SCr of 0.77 mg/dL).   Assessment: 52 yo man to start heparin for PE.  He was not on anticoagulation PTA.  Baseline Hg 17.1, PTLC 1873.  CT with evidence of right heart strain.  Follow up heparin level = 0.33. Will adjust rate to keep at middle of range.   Goal of Therapy:  Heparin level 0.3-0.7 units/ml Monitor platelets by anticoagulation protocol: Yes   Plan:  Increase heparin to 1850 units / hr to prevent drop Daily HL and CBC while on heparin Monitor for bleeding complications   Thank you Sheppard CoilFrank Wilson PharmD., BCPS Clinical Pharmacist 07/20/2018 9:12 PM

## 2018-07-20 NOTE — Progress Notes (Signed)
[facility] Progress Note  Subjective: (NO CHARGE, pt admitted after MN today). Still having R sided pleuritic CP, no other c/o.  BP's are good no hypotension or resp distress  Vitals:   07/20/18 0430 07/20/18 0445 07/20/18 0500 07/20/18 0715  BP: 138/85 120/67 137/85 (!) 134/94  Pulse: 100 99 (!) 105 89  Resp: (!) 28 (!) 23 (!) 26 (!) 25  Temp:      TempSrc:      SpO2: 96% 98% 97% 99%  Weight:      Height:        Inpatient medications:  . heparin 1,650 Units/hr (07/20/18 0436)   acetaminophen **OR** acetaminophen, albuterol, ondansetron **OR** ondansetron (ZOFRAN) IV  Exam:  alert, no distress  no jvd  chest cta bilat  cor reg no mrg  abd soft ntnd no mass or ascites  ext no edema   Presentation Summary: Allen Savage is a 52 y.o. male without past medical history; who presented to the emergency department with complaints of chest pain.  Symptoms started last night around 9 PM.   Pain noted most significantly on right side of the chest and worsened with taking inspiratory breath.  He noted some shortness of breath.  Associated symptoms include right leg pain and swelling which he reports present for at least 1 week.  He does not have a regular primary care provider with whom he follows.  Denies any hemoptysis, palpitations, loss of consciousness, fever, chills, nausea, vomiting, abdominal pain, dysuria, recent history of surgery, or family history of clotting disorder.  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   ED Course: Patient was found to have bilateral pulmonary emboli with signs of right heart strain on CT angiogram of the chest. Hypercoagulable panel drawn in ED.  He was started on a heparin drip per pharmacy.  ER provider discussed case with Dr. Arsenio LoaderSommer of PCCM who believed patient stable for SDU; PCCM happy to consult if patient acutely decompensates or if there are concerning findings to the RV on 2-D echo.[facility] called to  admit.       Impression/ Plan:  Pulmonary emboli: Acute.  Patient presented with complaints of chest pain and shortness of breath.  CT angiogram revealed bilateral PEs with signs of right heart strain.  CCM called by ED and will be happy to consult if patient acutely decompensates or if there are concerning findings to the RV on 2-D echo.   Hypercoag panel drawn in ED.  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 - cont telemetry and IV heparin x 48 hrs for R heart strain on CT - CM consult for apixaban Rx 3 mos, pt would prefer NOAC if can afford as opposed to coumadin - Echocardiogram and doppler duplex of the lower extremity ordered for today - Patient in need of a primary care provider  Alcohol use: Patient reported anywhere from 1-3 beers/ day.  Denies any history of withdrawals. - Monitor for need of placement on CIWA protocols   DVT prophylaxis: heparin Code Status: Full Family Communication: Discussed plan of care with the patient family present bedside Disposition Plan: Likley discharge home in 1 to 2 days Consults called: None Admission status: observation     Vinson Moselleob Oreta Soloway MD [facility] pgr [phone] 07/20/2018, 7:54 AM   Recent Labs  Lab 07/20/18 0011  NA 140  K 4.1  CL 103  CO2 23  GLUCOSE 93  BUN 7  CREATININE 0.77  CALCIUM 9.3   Recent Labs  Lab 07/20/18 0011  AST 21  ALT 13  ALKPHOS 84  BILITOT 0.8  PROT 6.3*  ALBUMIN 3.1*   Recent Labs  Lab 07/20/18 0011  WBC 7.6  NEUTROABS 4.7  HGB 17.1*  HCT 51.9  MCV 93.7  PLT 183   Iron/TIBC/Ferritin/ %Sat No results found for: IRON, TIBC, FERRITIN, IRONPCTSAT

## 2018-07-20 NOTE — Progress Notes (Signed)
  Echocardiogram 2D Echocardiogram has been performed.  Allen PartridgeBrooke S Jenille Savage 07/20/2018, 3:37 PM

## 2018-07-20 NOTE — Progress Notes (Signed)
Pt received from ED. Pt given CHG bath and tele box 4E25 applied. Vitals stable. NSR on monitor. Pt c/o pain to right chest. Pt has bilateral PE's.  Pt belongings in bag at bedside. Lacy DuverneyJennifer Kimball Appleby, RN

## 2018-07-20 NOTE — ED Notes (Signed)
Patient transported to CT scan . 

## 2018-07-20 NOTE — Progress Notes (Signed)
ANTICOAGULATION CONSULT NOTE Pharmacy Consult for heparin Indication: pulmonary embolus  No Known Allergies  Patient Measurements: Height: 5\' 9"  (175.3 cm) Weight: 210 lb (95.3 kg) IBW/kg (Calculated) : 70.7 Heparin Dosing Weight: 90.4 kg  Vital Signs: Temp: 97.8 F (36.6 C) (08/27 0800) Temp Source: Oral (08/27 0800) BP: 134/94 (08/27 0715) Pulse Rate: 89 (08/27 0715)  Labs: Recent Labs    07/20/18 0011 07/20/18 1146  HGB 17.1*  --   HCT 51.9  --   PLT 183  --   HEPARINUNFRC  --  0.42  CREATININE 0.77  --     Estimated Creatinine Clearance: 127.2 mL/min (by C-G formula based on SCr of 0.77 mg/dL).   Assessment: 52 yo man to start heparin for PE.  He was not on anticoagulation PTA.  Baseline Hg 17.1, PTLC 1873.  CT with evidence of right heart strain.  Initial heparin level = 0.42  Goal of Therapy:  Heparin level 0.3-0.7 units/ml Monitor platelets by anticoagulation protocol: Yes   Plan:  Increase heparin to 1750 units / hr to prevent drop Confirm heparin level at 8 pm Daily HL and CBC while on heparin Monitor for bleeding complications   Thank you Okey RegalLisa Lenea Bywater, PharmD [phone] 07/20/2018,12:40 PM

## 2018-07-21 ENCOUNTER — Other Ambulatory Visit: Payer: Self-pay

## 2018-07-21 ENCOUNTER — Encounter (HOSPITAL_COMMUNITY): Payer: Self-pay

## 2018-07-21 DIAGNOSIS — I2609 Other pulmonary embolism with acute cor pulmonale: Secondary | ICD-10-CM

## 2018-07-21 LAB — CBC
HCT: 50.3 % (ref 39.0–52.0)
Hemoglobin: 16.2 g/dL (ref 13.0–17.0)
MCH: 30.7 pg (ref 26.0–34.0)
MCHC: 32.2 g/dL (ref 30.0–36.0)
MCV: 95.4 fL (ref 78.0–100.0)
PLATELETS: 158 10*3/uL (ref 150–400)
RBC: 5.27 MIL/uL (ref 4.22–5.81)
RDW: 15.8 % — ABNORMAL HIGH (ref 11.5–15.5)
WBC: 8 10*3/uL (ref 4.0–10.5)

## 2018-07-21 LAB — BETA-2-GLYCOPROTEIN I ABS, IGG/M/A
Beta-2 Glyco I IgG: <9 GPI IgG units (ref 0–20)
Beta-2-Glycoprotein I IgA: <9 GPI IgA units (ref 0–25)
Beta-2-Glycoprotein I IgM: <9 GPI IgM units (ref 0–32)

## 2018-07-21 LAB — BASIC METABOLIC PANEL
Anion gap: 8 (ref 5–15)
BUN: 7 mg/dL (ref 6–20)
CO2: 27 mmol/L (ref 22–32)
CREATININE: 0.82 mg/dL (ref 0.61–1.24)
Calcium: 8.8 mg/dL — ABNORMAL LOW (ref 8.9–10.3)
Chloride: 103 mmol/L (ref 98–111)
GFR calc non Af Amer: >60 mL/min (ref >60)
Glucose, Bld: 108 mg/dL — ABNORMAL HIGH (ref 70–99)
POTASSIUM: 4.7 mmol/L (ref 3.5–5.1)
Sodium: 138 mmol/L (ref 135–145)

## 2018-07-21 LAB — HEPARIN LEVEL (UNFRACTIONATED): HEPARIN UNFRACTIONATED: 0.49 [IU]/mL (ref 0.30–0.70)

## 2018-07-21 LAB — HOMOCYSTEINE: Homocysteine: 19.7 umol/L — ABNORMAL HIGH (ref 0.0–15.0)

## 2018-07-21 LAB — PROTEIN C, TOTAL: PROTEIN C, TOTAL: 46 % — AB (ref 60–150)

## 2018-07-21 MED ORDER — APIXABAN 5 MG PO TABS
10.0000 mg | ORAL_TABLET | Freq: Two times a day (BID) | ORAL | Status: DC
  Administered 2018-07-21 – 2018-07-22 (×3): 10 mg via ORAL
  Filled 2018-07-21 (×3): qty 2

## 2018-07-21 MED ORDER — ELIQUIS 5 MG VTE STARTER PACK: ORAL_TABLET | ORAL | 0 refills | Status: DC

## 2018-07-21 MED FILL — ELIQUIS STARTER PACK 5 MG T: 5 | 30 days supply | Qty: 74 | Fill #0

## 2018-07-21 NOTE — Progress Notes (Signed)
PROGRESS NOTE    Allen Savage  WUJ:[id number] DOB: 1966/06/19 DOA: 07/19/2018 PCP: Patient, No Pcp Per   Brief Narrative:  Allen Savage is Allen Savage 52 y.o. male without past medical history; who presented to the emergency department with complaints of chest pain.  Symptoms started last night around 9 PM.   Pain noted most significantly on right side of the chest and worsened with taking inspiratory breath.  He noted some shortness of breath.  Associated symptoms include right leg pain and swelling which he reports present for at least 1 week.  He does not have Allen Savage regular primary care provider with whom he follows.  Denies any hemoptysis, palpitations, loss of consciousness, fever, chills, nausea, vomiting, abdominal pain, dysuria, recent history of surgery, or family history of clotting disorder.  He works as [place] [employer] and normally drives for prolonged periods in time.  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 Presented with complaints of chest pain and shortness of breath. CT angiogram revealed bilateral PEs with signs of right heart strain.   - Hypercoag panel drawn in ED and pending at this time.  - Most likely provoking factor is his job -CT with bilateral pulmonary emboli with R heart strain - Echo with EF 60-65%, normal RV size and systolic function.  Grade 1 diastolic dysfunction. - [place] with DVT in popliteal, posterior tibial, and peroneal veins on R - Given hemodynamic stability, will transition to eliquis today. - Will need at least 3 months anticoagulation (but would consider indefinite with pt's occupation and submassive PE) -Patient in need of Allen Savage primary care provider  Alcohol OZH:YQMVHQI reported anywhere from 1-3 beers/ day. Denies any history of  withdrawals. -Monitor for need of placement on CIWAprotocols - Stable, follow   Elevated blood pressure:  BP fluctuating here, not on any meds.  Most BP's area within appropriate range, but recently some elevated.  Will follow and consider starting amlodipine.  DVT prophylaxis: eliquis Code Status: full  Family Communication: significant other at bedside Disposition Plan: within 24-48 hours  Consultants:   none  Procedures:  Final Interpretation: Right: There is evidence of acute DVT in the Popliteal vein, Posterior Tibial veins, and Peroneal veins. No cystic structure found in the popliteal fossa. Left: No evidence of common femoral vein obstruction. No cystic structure found in the popliteal fossa.  Study Conclusions  - Left ventricle: The cavity size was normal. Wall thickness was   normal. Systolic function was normal. The estimated ejection   fraction was in the range of 60% to 65%. Although no diagnostic   regional wall motion abnormality was identified, this possibility   cannot be completely excluded on the basis of this study. Doppler   parameters are consistent with abnormal left ventricular   relaxation (grade 1 diastolic dysfunction). - Aortic valve: There was no stenosis. - Aorta: Mildly dilated aortic root. Aortic root dimension: 38 mm   (ED). - Mitral valve: There was no significant regurgitation. - Right ventricle: The cavity size was normal. Systolic function   was normal. - Pulmonary arteries: No complete TR doppler jet so unable to   estimate PA systolic pressure. - Inferior vena cava: The vessel was normal in size. The   respirophasic diameter changes were in the normal range (>=  50%),   consistent with normal central venous pressure.  Impressions:  - Normal LV size with EF 60-65%. Normal RV size and systolic   function. No significant valvular abnormalities.  Antimicrobials:  Anti-infectives (From admission, onward)   None          Subjective: Notes calf swelling 2 weeks ago.  CP about 1 week ago. Notes continued pleuritic CP at this time. No SOB at this time or CP at this time, but when he takes deep breath or is up and about he notices this.  Objective: Vitals:   07/20/18 2000 07/20/18 2102 07/21/18 0346 07/21/18 0936  BP:  (!) 150/93 140/64   Pulse: 90 87 85   Resp: 18 (!) 23 (!) 30 (!) 22  Temp:  98.7 F (37.1 C) 99.1 F (37.3 C)   TempSrc:  Oral Oral   SpO2: 100% 98% 98%   Weight:      Height:        Intake/Output Summary (Last 24 hours) at 07/21/2018 1246 Last data filed at 07/20/2018 2100 Gross per 24 hour  Intake 227 ml  Output 400 ml  Net -173 ml   Filed Weights   07/19/18 2355  Weight: 95.3 kg    Examination:  General exam: Appears calm and comfortable  Respiratory system: Clear to auscultation. Respiratory effort normal. Cardiovascular system: S1 & S2 heard, RRR.  Gastrointestinal system: Abdomen is nondistended, soft and nontender. Central nervous system: Alert and oriented. No focal neurological deficits. Extremities: Mild RLE edema Skin: No rashes, lesions or ulcers Psychiatry: Judgement and insight appear normal. Mood & affect appropriate.     Data Reviewed: I have personally reviewed following labs and imaging studies  CBC: Recent Labs  Lab 07/20/18 0011 07/21/18 0350  WBC 7.6 8.0  NEUTROABS 4.7  --   HGB 17.1* 16.2  HCT 51.9 50.3  MCV 93.7 95.4  PLT 183 158   Basic Metabolic Panel: Recent Labs  Lab 07/20/18 0011 07/21/18 0350  NA 140 138  K 4.1 4.7  CL 103 103  CO2 23 27  GLUCOSE 93 108*  BUN 7 7  CREATININE 0.77 0.82  CALCIUM 9.3 8.8*   GFR: Estimated Creatinine Clearance: 124.1 mL/min (by C-G formula based on SCr of 0.82 mg/dL). Liver Function Tests: Recent Labs  Lab 07/20/18 0011  AST 21  ALT 13  ALKPHOS 84  BILITOT 0.8  PROT 6.3*  ALBUMIN 3.1*   Recent Labs  Lab 07/20/18 0011  LIPASE 47   No results for input(s): AMMONIA in the  last 168 hours. Coagulation Profile: No results for input(s): INR, PROTIME in the last 168 hours. Cardiac Enzymes: No results for input(s): CKTOTAL, CKMB, CKMBINDEX, TROPONINI in the last 168 hours. BNP (last 3 results) No results for input(s): PROBNP in the last 8760 hours. HbA1C: No results for input(s): HGBA1C in the last 72 hours. CBG: No results for input(s): GLUCAP in the last 168 hours. Lipid Profile: No results for input(s): CHOL, HDL, LDLCALC, TRIG, CHOLHDL, LDLDIRECT in the last 72 hours. Thyroid Function Tests: No results for input(s): TSH, T4TOTAL, FREET4, T3FREE, THYROIDAB in the last 72 hours. Anemia Panel: No results for input(s): VITAMINB12, FOLATE, FERRITIN, TIBC, IRON, RETICCTPCT in the last 72 hours. Sepsis Labs: No results for input(s): PROCALCITON, LATICACIDVEN in the last 168 hours.  No results found for this or any previous visit (from the past 240 hour(s)).       Radiology Studies: Dg Chest 2 View  Result Date: 07/20/2018 CLINICAL  DATA:  52 year old male with chest pain and shortness of breath. EXAM: CHEST - 2 VIEW COMPARISON:  None. FINDINGS: There is minimal indentation of the right hemidiaphragm. The lungs are clear. No pleural effusion or pneumothorax. The cardiac silhouette is within normal limits. No acute osseous pathology. IMPRESSION: No active cardiopulmonary disease. Electronically Signed   By: Elgie Collard M.D.   On: 07/20/2018 00:35   Ct Angio Chest Pe W And/or Wo Contrast  Result Date: 07/20/2018 CLINICAL DATA:  52 year old male with chest pain. Concern for pulmonary embolism. EXAM: CT ANGIOGRAPHY CHEST WITH CONTRAST TECHNIQUE: Multidetector CT imaging of the chest was performed using the standard protocol during bolus administration of intravenous contrast. Multiplanar CT image reconstructions and MIPs were obtained to evaluate the vascular anatomy. CONTRAST:  <See Chart> ISOVUE-370 IOPAMIDOL (ISOVUE-370) INJECTION 76% COMPARISON:  Chest  radiograph dated 07/20/2018 FINDINGS: Cardiovascular: There is no cardiomegaly or pericardial effusion. The thoracic aorta is unremarkable. The origins of the great vessels of the aortic arch are patent. There is Shanteria Laye large right pulmonary artery embolus involving the lobar branches of the middle and lower lobes extending into the segmental and subsegmental branches. Left lower lobe segmental pulmonary artery emboli noted. The right ventricle measures 4.2 cm in diameter and the left ventricle measures 4.6 cm in diameter (series 7 image 235). Mediastinum/Nodes: No hilar or mediastinal adenopathy. Esophagus and the thyroid gland are grossly unremarkable. No mediastinal fluid collection. Lungs/Pleura: There are bibasilar atelectatic changes. Focal area of hazy and nodular density in the right middle lobe may represent developing infarct. There is Neveen Daponte 3 mm right middle lobe pulmonary nodule (series 6, image 69). There is no pleural effusion or pneumothorax. The central airways are patent. Upper Abdomen: No acute abnormality. Musculoskeletal: No chest wall abnormality. No acute or significant osseous findings. Review of the MIP images confirms the above findings. IMPRESSION: Bilateral pulmonary artery emboli larger and occlusive on the right. The RV/LV ratio is 0.9. Positive for acute PE with CT evidence of right heart strain (RV/LV Ratio = 0.9) consistent with at least submassive (intermediate risk) PE. The presence of right heart strain has been associated with an increased risk of morbidity and mortality. Please activate Code PE by paging [phone]. These results were called by telephone at the time of interpretation on 07/20/2018 at 4:01 am to Dr. Allison Quarry, who verbally acknowledged these results. Electronically Signed   By: Elgie Collard M.D.   On: 07/20/2018 04:02        Scheduled Meds: . apixaban  10 mg Oral BID   Continuous Infusions:   LOS: 1 day    Time spent: over 30 min MDM mod with multiple  medical issues    Lacretia Nicks, MD [facility] Pager [phone]  If 7PM-7AM, please contact night-coverage [email or web address] Password [facility] 07/21/2018, 12:46 PM

## 2018-07-21 NOTE — Progress Notes (Addendum)
ANTICOAGULATION CONSULT NOTE Pharmacy Consult for heparin Indication: pulmonary embolus  No Known Allergies  Patient Measurements: Height: 5\' 9"  (175.3 cm) Weight: 210 lb (95.3 kg) IBW/kg (Calculated) : 70.7 Heparin Dosing Weight: 90.4 kg  Vital Signs: Temp: 99.1 F (37.3 C) (08/28 0346) Temp Source: Oral (08/28 0346) BP: 140/64 (08/28 0346) Pulse Rate: 85 (08/28 0346)  Labs: Recent Labs    07/20/18 0011 07/20/18 1146 07/20/18 2000 07/21/18 0350  HGB 17.1*  --   --  16.2  HCT 51.9  --   --  50.3  PLT 183  --   --  158  HEPARINUNFRC  --  0.42 0.33 0.49  CREATININE 0.77  --   --  0.82    Estimated Creatinine Clearance: 124.1 mL/min (by C-G formula based on SCr of 0.82 mg/dL).   Assessment: 52 yo man to start heparin for PE.  He was not on anticoagulation PTA.   Heparin level therapeutic this AM CBC stable   Goal of Therapy:  Heparin level 0.3-0.7 units/ml Monitor platelets by anticoagulation protocol: Yes   Plan:  Continue heparin at 1850 units / hr Daily HL and CBC while on heparin Monitor for bleeding complications  Follow up for change to DOAC --> 11 AM -- Transitioned to Elqiuis 10 mg po BID x 7 days then 5 mg po BID   Thank you Okey RegalLisa Chaselynn Kepple, PharmD [phone] 07/21/2018 9:04 AM

## 2018-07-21 NOTE — Discharge Instructions (Signed)
Information on my medicine - ELIQUIS® (apixaban) ° °This medication education was reviewed with me or my healthcare representative as part of my discharge preparation.  The pharmacist that spoke with me during my hospital stay was:  °Evelina Lore Kay, RPH ° °Why was Eliquis® prescribed for you? °Eliquis® was prescribed to treat blood clots that may have been found in the veins of your legs (deep vein thrombosis) or in your lungs (pulmonary embolism) and to reduce the risk of them occurring again. ° °What do You need to know about Eliquis® ? °The starting dose is 10 mg (two 5 mg tablets) taken TWICE daily for the FIRST SEVEN (7) DAYS,  the dose is reduced to ONE 5 mg tablet taken TWICE daily.  Eliquis® may be taken with or without food.  ° °Try to take the dose about the same time in the morning and in the evening. If you have difficulty swallowing the tablet whole please discuss with your pharmacist how to take the medication safely. ° °Take Eliquis® exactly as prescribed and DO NOT stop taking Eliquis® without talking to the doctor who prescribed the medication.  Stopping may increase your risk of developing a new blood clot.  Refill your prescription before you run out. ° °After discharge, you should have regular check-up appointments with your healthcare provider that is prescribing your Eliquis®. °   °What do you do if you miss a dose? °If a dose of ELIQUIS® is not taken at the scheduled time, take it as soon as possible on the same day and twice-daily administration should be resumed. The dose should not be doubled to make up for a missed dose. ° °Important Safety Information °A possible side effect of Eliquis® is bleeding. You should call your healthcare provider right away if you experience any of the following: °? Bleeding from an injury or your nose that does not stop. °? Unusual colored urine (red or dark brown) or unusual colored stools (red or black). °? Unusual bruising for unknown reasons. °? A serious  fall or if you hit your head (even if there is no bleeding). ° °Some medicines may interact with Eliquis® and might increase your risk of bleeding or clotting while on Eliquis®. To help avoid this, consult your healthcare provider or pharmacist prior to using any new prescription or non-prescription medications, including herbals, vitamins, non-steroidal anti-inflammatory drugs (NSAIDs) and supplements. ° °This website has more information on Eliquis® (apixaban): http://www.eliquis.com/eliquis/home ° °

## 2018-07-21 NOTE — Care Management Note (Addendum)
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 Referral for Eliquis and PCP needs received- Per benefits check pt has not met deductible and will have out of pocket cost for Eliquis- discussed coverage with pt and provided both 30 day free card and copay assist cards for Eliquis. Pt also does not have PCP- provided pt info on [employer] and also how to call insurance for in-network providers- encouraged pt to make calls this week in order to secure appointment with provider for new pt. As soon as possible.   Expected Discharge Date:     07/22/18             Expected Discharge Plan:  Home/Self Care  In-House Referral:     Discharge planning Services  CM Consult, Medication Assistance, Other - See comment  Post Acute Care Choice:  NA Choice offered to:  NA  DME Arranged:    DME Agency:     HH Arranged:    HH Agency:     Status of Service:  Completed, signed off  If discussed at [place] of Stay Meetings, dates discussed:    Discharge Disposition: home/self care   Additional Comments:  Dawayne Patricia, RN 07/21/2018, 11:04 AM

## 2018-07-22 DIAGNOSIS — I2699 Other pulmonary embolism without acute cor pulmonale: Principal | ICD-10-CM

## 2018-07-22 LAB — BASIC METABOLIC PANEL
Anion gap: 9 (ref 5–15)
BUN: 5 mg/dL — ABNORMAL LOW (ref 6–20)
CALCIUM: 8.8 mg/dL — AB (ref 8.9–10.3)
CO2: 25 mmol/L (ref 22–32)
Chloride: 102 mmol/L (ref 98–111)
Creatinine, Ser: 0.84 mg/dL (ref 0.61–1.24)
GFR calc Af Amer: >60 mL/min (ref >60)
GFR calc non Af Amer: >60 mL/min (ref >60)
GLUCOSE: 112 mg/dL — AB (ref 70–99)
Potassium: 3.7 mmol/L (ref 3.5–5.1)
Sodium: 136 mmol/L (ref 135–145)

## 2018-07-22 LAB — PROTEIN S ACTIVITY: Protein S Activity: 60 % — ABNORMAL LOW (ref 63–140)

## 2018-07-22 LAB — CBC
HCT: 50.1 % (ref 39.0–52.0)
HEMOGLOBIN: 16.1 g/dL (ref 13.0–17.0)
MCH: 30.6 pg (ref 26.0–34.0)
MCHC: 32.1 g/dL (ref 30.0–36.0)
MCV: 95.2 fL (ref 78.0–100.0)
Platelets: 184 10*3/uL (ref 150–400)
RBC: 5.26 MIL/uL (ref 4.22–5.81)
RDW: 15.8 % — AB (ref 11.5–15.5)
WBC: 8.3 10*3/uL (ref 4.0–10.5)

## 2018-07-22 LAB — PROTEIN S, TOTAL: Protein S Ag, Total: 72 % (ref 60–150)

## 2018-07-22 LAB — LUPUS ANTICOAGULANT PANEL
DRVVT: 37.8 s (ref 0.0–47.0)
PTT LA: 34.9 s (ref 0.0–51.9)

## 2018-07-22 LAB — CARDIOLIPIN ANTIBODIES, IGG, IGM, IGA: Anticardiolipin IgG: <9 GPL U/mL (ref 0–14)

## 2018-07-22 LAB — PROTEIN C ACTIVITY: Protein C Activity: 43 % — ABNORMAL LOW (ref 73–180)

## 2018-07-22 LAB — MAGNESIUM: Magnesium: 2 mg/dL (ref 1.7–2.4)

## 2018-07-22 MED ORDER — OXYCODONE HCL 5 MG PO TABS: 5.0000 mg | ORAL_TABLET | Freq: Four times a day (QID) | ORAL | 0 refills | Status: DC | PRN

## 2018-07-22 MED ORDER — APIXABAN 5 MG PO TABS: 5.0000 mg | ORAL_TABLET | Freq: Two times a day (BID) | ORAL | 0 refills | Status: DC

## 2018-07-22 NOTE — Discharge Summary (Signed)
Physician Discharge Summary  Allen Savage VFI:[id number] DOB: 08-16-1966 DOA: 07/19/2018  PCP: Patient, No Pcp Per  Admit date: 07/19/2018 Discharge date: 07/22/2018  Time spent: 35 minutes  Recommendations for Outpatient Follow-up:  1. Follow up outpatient CBC/CMP 2. Follow up with PCP 3. Follow up duration of anticoagulation.  Pt with likely provoked VTE given his occupation.  Recommending at least 3 months anticoagulation, then reassessment of risks/benefits.  If he returns to his previous occupation, he'd likely benefit from Amadeus Oyama longer duration of anticoaguation (also see hypercoag w/u below). 4. Of note, pt with low protein C and protein S (which can be lowered in setting of acute thrombosis).  Low antithrombin III, but again in setting of acute thrombus.  Elevated homocysteine, but unclear significance of this.  Follow up factor 5 leidin and prothrombin gene mutation as outpatient.  Discussed with pt.  Negative cardiolipin antibodies, negative beta-2-glycoprotein, negative lupus anticoagulant.  Will need to be followed up as outpatient.  5. Follow up BP as outpatient   Discharge Diagnoses:  Principal Problem:   Pulmonary emboli (HCC) Active Problems:   Pulmonary embolism (HCC)   Discharge Condition: stable  Diet recommendation: heart healthy  Filed Weights   07/19/18 2355  Weight: 95.3 kg    History of present illness:  Allen Savage 51 y.o.malewithoutpastmedical history;who presented to the emergency department with complaints of chest pain. Symptoms started last night around 9 PM. Pain noted most significantly on right side of the chest and worsened with taking inspiratory breath. He noted some shortness of breath. Associated symptoms include right leg pain and swelling which he reports present for at least 1 week. He does not have Seabron Iannello regular primary care provider with whom he follows. Denies any hemoptysis, palpitations, loss of consciousness, fever, chills,  nausea, vomiting, abdominal pain, dysuria, recent history of surgery, or family history of clotting disorder. 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  He was admitted for Aleana Fifita submassive PE.  He was found to have Mickaela Starlin right lower extremity DVT as well.  He was anticoagulated with heparin and transitioned to eliquis.  Hypercoagulable w/u was sent.  Recommendation for at least 3 months anticoagulation.  See below for additional details.  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 Presentedwith complaints of chest painandshortness of breath. CT angiogram revealedbilateral PEs with signs of right heart strain.  - Hypercoag panel drawn in ED and pending at this time (see above).  - Most likely provoking factor is his job (bus driver with extended periods of driving - he notes breaks are dependent on schedule at times) -CT with bilateral pulmonary emboli with R heart strain - Echo with EF 60-65%, normal RV size and systolic function.  Grade 1 diastolic dysfunction. - US with DVT in popliteal, posterior tibial, and peroneal veins on R - Given hemodynamic stability, will transition to eliquis today. - Will need at least 3 months anticoagulation (but would consider prolonging this course depending if he continues to drive and with the submassive nature of his PE)  -Patient in need of Marvelyn Bouchillon primary care provider - given resources for this  Alcohol SAY:TKZSWFUuse:Patient reportedanywhere from 1-3 beers/ day. Denies any history of withdrawals. -Discussed importance of moderation and discussed what this meant  Elevated blood pressure:  BP fluctuating here, not on any meds.  Most BP's area within appropriate  range, but recently some elevated.  Follow up outpatient.  Procedures: Study Conclusions  - Left ventricle: The cavity size was normal. Wall  thickness was   normal. Systolic function was normal. The estimated ejection   fraction was in the range of 60% to 65%. Although no diagnostic   regional wall motion abnormality was identified, this possibility   cannot be completely excluded on the basis of this study. Doppler   parameters are consistent with abnormal left ventricular   relaxation (grade 1 diastolic dysfunction). - Aortic valve: There was no stenosis. - Aorta: Mildly dilated aortic root. Aortic root dimension: 38 mm   (ED). - Mitral valve: There was no significant regurgitation. - Right ventricle: The cavity size was normal. Systolic function   was normal. - Pulmonary arteries: No complete TR doppler jet so unable to   estimate PA systolic pressure. - Inferior vena cava: The vessel was normal in size. The   respirophasic diameter changes were in the normal range (>= 50%),   consistent with normal central venous pressure.  Impressions:  - Normal LV size with EF 60-65%. Normal RV size and systolic   function. No significant valvular abnormalities.  Final Interpretation: Right: There is evidence of acute DVT in the Popliteal vein, Posterior Tibial veins, and Peroneal veins. No cystic structure found in the popliteal fossa. Left: No evidence of common femoral vein obstruction. No cystic structure found in the popliteal fossa.  Consultations:  none  Discharge Exam: Vitals:   07/21/18 1937 07/22/18 0323  BP: 133/85 130/90  Pulse: 99 84  Resp: (!) 26 (!) 24  Temp: 99.4 F (37.4 C) 98.5 F (36.9 C)  SpO2: 97% 98%   Feeling better.  Less pleuritic CP.  General: No acute distress. Cardiovascular: Heart sounds show Garnette Greb regular rate, and rhythm.  Lungs: Clear to auscultation bilaterally with good air movement. (able to take more deep breaths today with less pleuritic pain) Abdomen: Soft, nontender, nondistended Neurological: Alert and oriented 3. Moves all extremities 4. Cranial nerves II through XII grossly  intact. Skin: Warm and dry. No rashes or lesions. Extremities: No clubbing or cyanosis. RLE, mild. Psychiatric: Mood and affect are normal. Insight and judgment are appropriate.   Discharge Instructions   Discharge Instructions    Call MD for:  difficulty breathing, headache or visual disturbances   Complete by:  As directed    Call MD for:  extreme fatigue   Complete by:  As directed    Call MD for:  persistant dizziness or light-headedness   Complete by:  As directed    Call MD for:  persistant nausea and vomiting   Complete by:  As directed    Call MD for:  redness, tenderness, or signs of infection (pain, swelling, redness, odor or green/yellow discharge around incision site)   Complete by:  As directed    Call MD for:  severe uncontrolled pain   Complete by:  As directed    Call MD for:  temperature >100.4   Complete by:  As directed    Diet - low sodium heart healthy   Complete by:  As directed    Discharge instructions   Complete by:  As directed    You were seen for Cathy Ropp pulmonary embolism.  We've started you on eliquis for this.  It's extremely important that you take this medicine every day as prescribed.  Your next dose is tonight.  You'll take 10 mg twice Riely Baskett day for the next 5.5 days (or  11 doses), then you'll switch to 5 mg twice Jenisa Monty day.  You'll continue this for at least 3 months, but as we discussed before, you may benefit from longer anticoagulation (based on your continued exposure to risk factors or labs that are pending) and I'd recommend Navdeep Fessenden discussion with your primary care provider before discontinuing this medication.  We sent labs to look at reasons that you might have Nachman Sundt blood clot and your PCP should follow this up with you.    I'd avoid aspirin, ibuprofen, naproxen, or other NSAIDs as these will increase the risk of bleeding.    It's extremely important that you establish with Navya Timmons primary care provider.  Please call to make an appointment today to follow up with Pailyn Bellevue  new provider.   Return for new, recurrent, or worsening symptoms.  Please ask your PCP to request records from this hospitalization so they know what was done and what the next steps will be.   Increase activity slowly   Complete by:  As directed      Allergies as of 07/22/2018   No Known Allergies     Medication List    TAKE these medications   dicyclomine 20 MG tablet Commonly known as:  BENTYL Take 1 tablet (20 mg total) by mouth 4 (four) times daily -  before meals and at bedtime. As needed for abdominal cramps   ELIQUIS STARTER PACK 5 MG Tabs Take as directed on package: start with two-5mg  tablets twice daily for 7 days. On day 8, switch to one-5mg  tablet twice daily.   apixaban 5 MG Tabs tablet Commonly known as:  ELIQUIS Take 1 tablet (5 mg total) by mouth 2 (two) times daily. (refill after you complete your starter pack) Start taking on:  08/19/2018   oxyCODONE 5 MG immediate release tablet Commonly known as:  Oxy IR/ROXICODONE Take 1 tablet (5 mg total) by mouth every 6 (six) hours as needed for up to 8 doses for severe pain.     PMP aware reviewed and appropriate No Known Allergies Follow-up Information    [facility]. Call.   Contact information: Please call [phone] for primary care referral list assistance           The results of significant diagnostics from this hospitalization (including imaging, microbiology, ancillary and laboratory) are listed below for reference.    Significant Diagnostic Studies: Dg Chest 2 View  Result Date: 07/20/2018 CLINICAL DATA:  52 year old male with chest pain and shortness of breath. EXAM: CHEST - 2 VIEW COMPARISON:  None. FINDINGS: There is minimal indentation of the right hemidiaphragm. The lungs are clear. No pleural effusion or pneumothorax. The cardiac silhouette is within normal limits. No acute osseous pathology. IMPRESSION: No active cardiopulmonary disease. Electronically Signed   By: Elgie Collard M.D.    On: 07/20/2018 00:35   Ct Angio Chest Pe W And/or Wo Contrast  Result Date: 07/20/2018 CLINICAL DATA:  52 year old male with chest pain. Concern for pulmonary embolism. EXAM: CT ANGIOGRAPHY CHEST WITH CONTRAST TECHNIQUE: Multidetector CT imaging of the chest was performed using the standard protocol during bolus administration of intravenous contrast. Multiplanar CT image reconstructions and MIPs were obtained to evaluate the vascular anatomy. CONTRAST:  <See Chart> ISOVUE-370 IOPAMIDOL (ISOVUE-370) INJECTION 76% COMPARISON:  Chest radiograph dated 07/20/2018 FINDINGS: Cardiovascular: There is no cardiomegaly or pericardial effusion. The thoracic aorta is unremarkable. The origins of the great vessels of the aortic arch are patent. There is Malik Paar large right pulmonary artery embolus involving the  lobar branches of the middle and lower lobes extending into the segmental and subsegmental branches. Left lower lobe segmental pulmonary artery emboli noted. The right ventricle measures 4.2 cm in diameter and the left ventricle measures 4.6 cm in diameter (series 7 image 235). Mediastinum/Nodes: No hilar or mediastinal adenopathy. Esophagus and the thyroid gland are grossly unremarkable. No mediastinal fluid collection. Lungs/Pleura: There are bibasilar atelectatic changes. Focal area of hazy and nodular density in the right middle lobe may represent developing infarct. There is Jianna Drabik 3 mm right middle lobe pulmonary nodule (series 6, image 69). There is no pleural effusion or pneumothorax. The central airways are patent. Upper Abdomen: No acute abnormality. Musculoskeletal: No chest wall abnormality. No acute or significant osseous findings. Review of the MIP images confirms the above findings. IMPRESSION: Bilateral pulmonary artery emboli larger and occlusive on the right. The RV/LV ratio is 0.9. Positive for acute PE with CT evidence of right heart strain (RV/LV Ratio = 0.9) consistent with at least submassive  (intermediate risk) PE. The presence of right heart strain has been associated with an increased risk of morbidity and mortality. Please activate Code PE by paging [phone]. These results were called by telephone at the time of interpretation on 07/20/2018 at 4:01 am to Dr. Allison Quarry, who verbally acknowledged these results. Electronically Signed   By: Elgie Collard M.D.   On: 07/20/2018 04:02    Microbiology: No results found for this or any previous visit (from the past 240 hour(s)).   Labs: Basic Metabolic Panel: Recent Labs  Lab 07/20/18 0011 07/21/18 0350 07/22/18 0336  NA 140 138 136  K 4.1 4.7 3.7  CL 103 103 102  CO2 23 27 25   GLUCOSE 93 108* 112*  BUN 7 7 5*  CREATININE 0.77 0.82 0.84  CALCIUM 9.3 8.8* 8.8*  MG  --   --  2.0   Liver Function Tests: Recent Labs  Lab 07/20/18 0011  AST 21  ALT 13  ALKPHOS 84  BILITOT 0.8  PROT 6.3*  ALBUMIN 3.1*   Recent Labs  Lab 07/20/18 0011  LIPASE 47   No results for input(s): AMMONIA in the last 168 hours. CBC: Recent Labs  Lab 07/20/18 0011 07/21/18 0350 07/22/18 0336  WBC 7.6 8.0 8.3  NEUTROABS 4.7  --   --   HGB 17.1* 16.2 16.1  HCT 51.9 50.3 50.1  MCV 93.7 95.4 95.2  PLT 183 158 184   Cardiac Enzymes: No results for input(s): CKTOTAL, CKMB, CKMBINDEX, TROPONINI in the last 168 hours. BNP: BNP (last 3 results) No results for input(s): BNP in the last 8760 hours.  ProBNP (last 3 results) No results for input(s): PROBNP in the last 8760 hours.  CBG: No results for input(s): GLUCAP in the last 168 hours.     Signed:  Lacretia Nicks MD.  [facility] 07/22/2018, 10:45 AM

## 2018-07-22 NOTE — Progress Notes (Signed)
Pt ambulated whole unit without complaint, SPO2 98-100% on room air.  Pt has received his eliquis pack from Pharm.  Will proceed with DC per order.

## 2018-07-27 LAB — PROTHROMBIN GENE MUTATION

## 2018-07-27 LAB — FACTOR 5 LEIDEN

## 2018-07-29 ENCOUNTER — Ambulatory Visit (INDEPENDENT_AMBULATORY_CARE_PROVIDER_SITE_OTHER): Payer: Commercial Managed Care - PPO | Admitting: Family Medicine

## 2018-07-29 ENCOUNTER — Telehealth: Payer: Self-pay | Admitting: Family Medicine

## 2018-07-29 ENCOUNTER — Encounter: Payer: Self-pay | Admitting: Family Medicine

## 2018-07-29 VITALS — BP 128/78 | HR 92 | Temp 97.8°F | Ht 69.0 in | Wt 193.6 lb

## 2018-07-29 DIAGNOSIS — I824Z9 Acute embolism and thrombosis of unspecified deep veins of unspecified distal lower extremity: Secondary | ICD-10-CM | POA: Diagnosis not present

## 2018-07-29 DIAGNOSIS — I2699 Other pulmonary embolism without acute cor pulmonale: Secondary | ICD-10-CM

## 2018-07-29 DIAGNOSIS — F524 Premature ejaculation: Secondary | ICD-10-CM | POA: Insufficient documentation

## 2018-07-29 MED ORDER — AMITRIPTYLINE HCL 50 MG PO TABS: 50.0000 mg | ORAL_TABLET | Freq: Every day | ORAL | 3 refills | Status: DC

## 2018-07-29 NOTE — Progress Notes (Signed)
Subjective:  Allen Savage is a 52 y.o. male who presents today with a chief complaint of hospital follow up for PE and to establish care.Marland Kitchen   HPI:  PE/DVT, new problem to provider Symptoms started a few weeks ago with pain and swelling in his RLE.  A few days after this, he started having sudden onset chest pain shortness of breath.  Called EMS and went to the emergency department where he was found to have a submassive PE in the right lower extremity DVT.  He was started on eliquis and discharged home.  Since then, symptoms have improved.  Still has little bit of pain with deep inspiration, however no other shortness of breath.  Right lower extremity pain is improving as well.  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  Premature ejaculation, new problem to provider Several year history.  Has tried Zoloft and Prozac in the past.  Currently on Paxil 30 mg daily.  Still has a significant amount of symptoms.  No premature ejaculation.  No dysuria.  ROS: Per HPI, otherwise a complete review of systems was negative.   PMH:  The following were reviewed and entered/updated in epic: Past Medical History:  Diagnosis Date  . H/O shoulder surgery    Patient Active Problem List   Diagnosis Date Noted  . Deep vein thrombosis (DVT) of distal vein of lower extremity (HCC) 07/29/2018  . Premature ejaculation 07/29/2018  . Pulmonary emboli (HCC) 07/20/2018   Past Surgical History:  Procedure Laterality Date  . SHOULDER SURGERY Bilateral     Family History  Problem Relation Age of Onset  . Diabetes Father     Medications- reviewed and updated Current Outpatient Medications  Medication Sig Dispense Refill  . amitriptyline (ELAVIL) 50 MG tablet Take 1 tablet (50 mg total) by mouth at bedtime. 90 tablet 3  . ELIQUIS STARTER PACK (ELIQUIS STARTER PACK) 5 MG TABS Take as directed on package: start with two-5mg  tablets twice daily for 7 days. On day 8, switch to one-5mg   tablet twice daily. 1 each 0   No current facility-administered medications for this visit.     Allergies-reviewed and updated No Known Allergies  Social History   Socioeconomic History  . Marital status: Legally Separated    Spouse name: Not on file  . Number of children: Not on file  . Years of education: Not on file  . Highest education level: Not on file  Occupational History  . Not on file  Social Needs  . Financial resource strain: Not on file  . Food insecurity:    Worry: Not on file    Inability: Not on file  . Transportation needs:    Medical: Not on file    Non-medical: Not on file  Tobacco Use  . Smoking status: Never Smoker  . Smokeless tobacco: Never Used  Substance and Sexual Activity  . Alcohol use: Yes    Alcohol/week: 1.0 standard drinks    Types: 1 Glasses of wine per week  . Drug use: No  . Sexual activity: Not on file  Lifestyle  . Physical activity:    Days per week: Not on file    Minutes per session: Not on file  . Stress: Not on file  Relationships  . Social connections:    Talks on phone: Not on file    Gets together: Not on file    Attends religious service: Not on file    Active  member of club or organization: Not on file    Attends meetings of clubs or organizations: Not on file    Relationship status: Not on file  Other Topics Concern  . Not on file  Social History Narrative  . Not on file    Objective:  Physical Exam: BP 128/78 (BP Location: Left Arm, Patient Position: Sitting, Cuff Size: Normal)   Pulse 92   Temp 97.8 F (36.6 C) (Oral)   Ht 5\' 9"  (1.753 m)   Wt 193 lb 9.6 oz (87.8 kg)   SpO2 98%   BMI 28.59 kg/m   Gen: NAD, resting comfortably CV: RRR with no murmurs appreciated Pulm: NWOB, CTAB with no crackles, wheezes, or rhonchi GI: Normal bowel sounds present. Soft, Nontender, Nondistended. MSK: No edema, cyanosis, or clubbing noted Skin: Warm, dry Neuro: Grossly normal, moves all extremities Psych: Normal  affect and thought content  Assessment/Plan:  Pulmonary emboli (HCC) Currently on anticoagulation with Eliquis.  He has a known risk factor with his occupation that will be difficult to modify. Patient does NOT want to be on lifelong anticoagulation.  His symptoms seem to be improving subjectively.  We will continue Eliquis for at least 3 months.  Given his risk factors and desire to not be on lifelong anticoagulation, will place referral to hematology for further evaluation and management.  Advised patient to stay out of work until cleared by hematology at least. Work excuse was given.   Deep vein thrombosis (DVT) of distal vein of lower extremity (HCC) See PE A/P. Continue eliquis. Refer to heme.   Premature ejaculation Patient has tried and failed several SSRIs.  We will start amitriptyline 50 mg daily.  Titrate dose as needed.  Preventative Healthcare Patient was instructed to return soon for CPE. Health Maintenance Due  Topic Date Due  . TETANUS/TDAP  08/07/1987  . INFLUENZA VACCINE  06/24/2018   Katina Degree. [name], MD 07/29/2018 3:05 PM

## 2018-07-29 NOTE — Assessment & Plan Note (Signed)
Currently on anticoagulation with Eliquis.  He has a known risk factor with his occupation that will be difficult to modify. Patient does NOT want to be on lifelong anticoagulation.  His symptoms seem to be improving subjectively.  We will continue Eliquis for at least 3 months.  Given his risk factors and desire to not be on lifelong anticoagulation, will place referral to hematology for further evaluation and management.  Advised patient to stay out of work until cleared by hematology at least. Work excuse was given.

## 2018-07-29 NOTE — Assessment & Plan Note (Signed)
See PE A/P. Continue eliquis. Refer to heme.

## 2018-07-29 NOTE — Assessment & Plan Note (Signed)
Patient has tried and failed several SSRIs.  We will start amitriptyline 50 mg daily.  Titrate dose as needed.

## 2018-07-29 NOTE — Telephone Encounter (Signed)
Patient stated he will bring his FMLA paperwork in tomorrow.

## 2018-07-29 NOTE — Patient Instructions (Signed)
It was very nice to see you today!  You will need to be on your blood thinner for at least 3 months and possible longer. We will refer you to hematology for further assistance.  Please take the amitriptyline and let me know if you have any side effects.  Come back soon for your physical.   Take care, Dr [name]

## 2018-07-30 DIAGNOSIS — Z0279 Encounter for issue of other medical certificate: Secondary | ICD-10-CM

## 2018-07-30 NOTE — Telephone Encounter (Signed)
Noted  

## 2018-08-05 ENCOUNTER — Encounter: Payer: Self-pay | Admitting: Oncology

## 2018-08-05 ENCOUNTER — Telehealth: Payer: Self-pay | Admitting: Oncology

## 2018-08-05 NOTE — Telephone Encounter (Signed)
Pt has been cld and scheduled to see Clenton PareKristin Curcio on 10/8 at 1pm. Pt aware to arrive 30 minutes early. Letter mailed.

## 2018-08-10 ENCOUNTER — Ambulatory Visit (INDEPENDENT_AMBULATORY_CARE_PROVIDER_SITE_OTHER): Payer: Commercial Managed Care - PPO | Admitting: Family Medicine

## 2018-08-10 ENCOUNTER — Encounter: Payer: Self-pay | Admitting: Family Medicine

## 2018-08-10 VITALS — BP 126/74 | HR 96 | Temp 98.7°F | Ht 69.0 in | Wt 197.0 lb

## 2018-08-10 DIAGNOSIS — Z125 Encounter for screening for malignant neoplasm of prostate: Secondary | ICD-10-CM | POA: Diagnosis not present

## 2018-08-10 DIAGNOSIS — R739 Hyperglycemia, unspecified: Secondary | ICD-10-CM

## 2018-08-10 DIAGNOSIS — Z0001 Encounter for general adult medical examination with abnormal findings: Secondary | ICD-10-CM | POA: Diagnosis not present

## 2018-08-10 DIAGNOSIS — I2699 Other pulmonary embolism without acute cor pulmonale: Secondary | ICD-10-CM

## 2018-08-10 DIAGNOSIS — Z13220 Encounter for screening for lipoid disorders: Secondary | ICD-10-CM | POA: Diagnosis not present

## 2018-08-10 DIAGNOSIS — Z1211 Encounter for screening for malignant neoplasm of colon: Secondary | ICD-10-CM

## 2018-08-10 NOTE — Assessment & Plan Note (Signed)
Check CBC and CMET.  Continue Eliquis.  Has referral to hematology pending.

## 2018-08-10 NOTE — Patient Instructions (Signed)
It was very nice to see you today!  We will check blood work and refer you for a colonoscopy.  Please keep your appointment with the blood doctor.  Come back in 1 year for your next physical.   Take care, Dr    Preventive Care 40-64 Years, Male Preventive care refers to lifestyle choices and visits with your health care provider that can promote health and wellness. What does preventive care include?  A yearly physical exam. This is also called an annual well check.  Dental exams once or twice a year.  Routine eye exams. Ask your health care provider how often you should have your eyes checked.  Personal lifestyle choices, including: ? Daily care of your teeth and gums. ? Regular physical activity. ? Eating a healthy diet. ? Avoiding tobacco and drug use. ? Limiting alcohol use. ? Practicing safe sex. ? Taking low-dose aspirin every day starting at age 50. What happens during an annual well check? The services and screenings done by your health care provider during your annual well check will depend on your age, overall health, lifestyle risk factors, and family history of disease. Counseling Your health care provider may ask you questions about your:  Alcohol use.  Tobacco use.  Drug use.  Emotional well-being.  Home and relationship well-being.  Sexual activity.  Eating habits.  Work and work environment.  Screening You may have the following tests or measurements:  Height, weight, and BMI.  Blood pressure.  Lipid and cholesterol levels. These may be checked every 5 years, or more frequently if you are over 50 years old.  Skin check.  Lung cancer screening. You may have this screening every year starting at age 55 if you have a 30-pack-year history of smoking and currently smoke or have quit within the past 15 years.  Fecal occult blood test (FOBT) of the stool. You may have this test every year starting at age 50.  Flexible sigmoidoscopy or  colonoscopy. You may have a sigmoidoscopy every 5 years or a colonoscopy every 10 years starting at age 50.  Prostate cancer screening. Recommendations will vary depending on your family history and other risks.  Hepatitis C blood test.  Hepatitis B blood test.  Sexually transmitted disease (STD) testing.  Diabetes screening. This is done by checking your blood sugar (glucose) after you have not eaten for a while (fasting). You may have this done every 1-3 years.  Discuss your test results, treatment options, and if necessary, the need for more tests with your health care provider. Vaccines Your health care provider may recommend certain vaccines, such as:  Influenza vaccine. This is recommended every year.  Tetanus, diphtheria, and acellular pertussis (Tdap, Td) vaccine. You may need a Td booster every 10 years.  Varicella vaccine. You may need this if you have not been vaccinated.  Zoster vaccine. You may need this after age 60.  Measles, mumps, and rubella (MMR) vaccine. You may need at least one dose of MMR if you were born in 1957 or later. You may also need a second dose.  Pneumococcal 13-valent conjugate (PCV13) vaccine. You may need this if you have certain conditions and have not been vaccinated.  Pneumococcal polysaccharide (PPSV23) vaccine. You may need one or two doses if you smoke cigarettes or if you have certain conditions.  Meningococcal vaccine. You may need this if you have certain conditions.  Hepatitis A vaccine. You may need this if you have certain conditions or if you travel or   work in places where you may be exposed to hepatitis A.  Hepatitis B vaccine. You may need this if you have certain conditions or if you travel or work in places where you may be exposed to hepatitis B.  Haemophilus influenzae type b (Hib) vaccine. You may need this if you have certain risk factors.  Talk to your health care provider about which screenings and vaccines you need and  how often you need them. This information is not intended to replace advice given to you by your health care provider. Make sure you discuss any questions you have with your health care provider. Document Released: 12/07/2015 Document Revised: 07/30/2016 Document Reviewed: 09/11/2015 Elsevier Interactive Patient Education  2018 Elsevier Inc.  

## 2018-08-10 NOTE — Progress Notes (Signed)
Subjective:  Allen Savage is a 52 y.o. male who presents today for his annual comprehensive physical exam.    HPI:  He has no acute complaints today.   Lifestyle Diet: No specific diets Exercise: No specific exercises.   Depression screen PHQ 2/9 08/10/2018  Decreased Interest 0  Down, Depressed, Hopeless 0  PHQ - 2 Score 0   Health Maintenance Due  Topic Date Due  . TETANUS/TDAP  08/07/1987  . COLONOSCOPY  08/06/2018     ROS: Positive for sweating, red eyes, and blood clots, otherwise a complete review of systems was negative.   PMH:  The following were reviewed and entered/updated in epic: Past Medical History:  Diagnosis Date  . H/O shoulder surgery    Patient Active Problem List   Diagnosis Date Noted  . Deep vein thrombosis (DVT) of distal vein of lower extremity (HCC) 07/29/2018  . Premature ejaculation 07/29/2018  . Pulmonary emboli (HCC) 07/20/2018   Past Surgical History:  Procedure Laterality Date  . SHOULDER SURGERY Bilateral     Family History  Problem Relation Age of Onset  . Diabetes Father     Medications- reviewed and updated Current Outpatient Medications  Medication Sig Dispense Refill  . amitriptyline (ELAVIL) 50 MG tablet Take 1 tablet (50 mg total) by mouth at bedtime. 90 tablet 3  . ELIQUIS STARTER PACK (ELIQUIS STARTER PACK) 5 MG TABS Take as directed on package: start with two-5mg  tablets twice daily for 7 days. On day 8, switch to one-5mg  tablet twice daily. 1 each 0   No current facility-administered medications for this visit.     Allergies-reviewed and updated No Known Allergies  Social History   Socioeconomic History  . Marital status: Legally Separated    Spouse name: Not on file  . Number of children: Not on file  . Years of education: Not on file  . Highest education level: Not on file  Occupational History  . Not on file  Social Needs  . Financial resource strain: Not on file  . Food insecurity:    Worry: Not  on file    Inability: Not on file  . Transportation needs:    Medical: Not on file    Non-medical: Not on file  Tobacco Use  . Smoking status: Never Smoker  . Smokeless tobacco: Never Used  Substance and Sexual Activity  . Alcohol use: Yes    Alcohol/week: 1.0 standard drinks    Types: 1 Glasses of wine per week  . Drug use: No  . Sexual activity: Not on file  Lifestyle  . Physical activity:    Days per week: Not on file    Minutes per session: Not on file  . Stress: Not on file  Relationships  . Social connections:    Talks on phone: Not on file    Gets together: Not on file    Attends religious service: Not on file    Active member of club or organization: Not on file    Attends meetings of clubs or organizations: Not on file    Relationship status: Not on file  Other Topics Concern  . Not on file  Social History Narrative  . Not on file    Objective:  Physical Exam: BP 126/74 (BP Location: Left Arm, Patient Position: Sitting, Cuff Size: Normal)   Pulse 96   Temp 98.7 F (37.1 C) (Oral)   Ht 5\' 9"  (1.753 m)   Wt 197 lb (89.4 kg)  SpO2 97%   BMI 29.09 kg/m   Body mass index is 29.09 kg/m. Wt Readings from Last 3 Encounters:  08/10/18 197 lb (89.4 kg)  07/29/18 193 lb 9.6 oz (87.8 kg)  07/19/18 210 lb (95.3 kg)   Gen: NAD, resting comfortably HEENT: TMs normal bilaterally. OP clear. No thyromegaly noted.  CV: RRR with no murmurs appreciated Pulm: NWOB, CTAB with no crackles, wheezes, or rhonchi GI: Normal bowel sounds present. Soft, Nontender, Nondistended. MSK: no edema, cyanosis, or clubbing noted Skin: warm, dry Neuro: CN2-12 grossly intact. Strength 5/5 in upper and lower extremities. Reflexes symmetric and intact bilaterally.  Psych: Normal affect and thought content  Assessment/Plan:  Pulmonary emboli (HCC) Check CBC and CMET.  Continue Eliquis.  Has referral to hematology pending.  Hyperglycemia Check A1c.  Preventative Healthcare: Referral  placed for colonoscopy.  Check lipid panel.  Check PSA.  Patient Counseling(The following topics were reviewed and/or handout was given):  -Nutrition: Stressed importance of moderation in sodium/caffeine intake, saturated fat and cholesterol, caloric balance, sufficient intake of fresh fruits, vegetables, and fiber.  -Stressed the importance of regular exercise.   -Substance Abuse: Discussed cessation/primary prevention of tobacco, alcohol, or other drug use; driving or other dangerous activities under the influence; availability of treatment for abuse.   -Injury prevention: Discussed safety belts, safety helmets, smoke detector, smoking near bedding or upholstery.   -Sexuality: Discussed sexually transmitted diseases, partner selection, use of condoms, avoidance of unintended pregnancy and contraceptive alternatives.   -Dental health: Discussed importance of regular tooth brushing, flossing, and dental visits.  -Health maintenance and immunizations reviewed. Please refer to Health maintenance section.  Return to care in 1 year for next preventative visit.   Katina Degree. [name], MD 08/10/2018 3:40 PM

## 2018-08-11 LAB — COMPREHENSIVE METABOLIC PANEL
ALK PHOS: 81 U/L (ref 39–117)
ALT: 12 U/L (ref 0–53)
AST: 16 U/L (ref 0–37)
Albumin: 3.7 g/dL (ref 3.5–5.2)
BILIRUBIN TOTAL: 0.4 mg/dL (ref 0.2–1.2)
BUN: 15 mg/dL (ref 6–23)
CO2: 30 mEq/L (ref 19–32)
Calcium: 9.2 mg/dL (ref 8.4–10.5)
Chloride: 105 mEq/L (ref 96–112)
Creatinine, Ser: 0.9 mg/dL (ref 0.40–1.50)
GFR: 114.86 mL/min (ref >60.00)
GLUCOSE: 92 mg/dL (ref 70–99)
Potassium: 4.3 mEq/L (ref 3.5–5.1)
SODIUM: 139 meq/L (ref 135–145)
TOTAL PROTEIN: 6.7 g/dL (ref 6.0–8.3)

## 2018-08-11 LAB — CBC
HCT: 49.4 % (ref 39.0–52.0)
Hemoglobin: 16.4 g/dL (ref 13.0–17.0)
MCHC: 33.1 g/dL (ref 30.0–36.0)
MCV: 93.3 fl (ref 78.0–100.0)
Platelets: 258 10*3/uL (ref 150.0–400.0)
RBC: 5.3 Mil/uL (ref 4.22–5.81)
RDW: 15.8 % — AB (ref 11.5–15.5)
WBC: 7 10*3/uL (ref 4.0–10.5)

## 2018-08-11 LAB — LIPID PANEL
CHOL/HDL RATIO: 4
Cholesterol: 196 mg/dL (ref 0–200)
HDL: 51.3 mg/dL (ref >39.00)
LDL CALC: 117 mg/dL — AB (ref 0–99)
NONHDL: 144.68
Triglycerides: 138 mg/dL (ref 0.0–149.0)
VLDL: 27.6 mg/dL (ref 0.0–40.0)

## 2018-08-11 LAB — HEMOGLOBIN A1C: Hgb A1c MFr Bld: 5.4 % (ref 4.6–6.5)

## 2018-08-11 LAB — PSA: PSA: 1.42 ng/mL (ref 0.10–4.00)

## 2018-08-11 NOTE — Telephone Encounter (Signed)
Allen Savage w/[employer] is trying to get pts disability claim approved today so they are able to send out a check for him. She is requesting return call today.  Pt notified that paperwork was brought into the office 08/05/18 and OV 08/10/18.  Ph# [phone] Claim # [id number]

## 2018-08-12 ENCOUNTER — Encounter: Payer: Self-pay | Admitting: Family Medicine

## 2018-08-12 DIAGNOSIS — E785 Hyperlipidemia, unspecified: Secondary | ICD-10-CM | POA: Insufficient documentation

## 2018-08-12 NOTE — Telephone Encounter (Signed)
Paperwork has been faxed to [employer] at number provided.

## 2018-08-12 NOTE — Telephone Encounter (Signed)
[name] called back wanting to [employer] know that she did receive the fax. [name] states that she does need a little more information regarding pt- Needing diagnosis and any recent surgeries (date&type).   JY#[phone]#[phone]

## 2018-08-12 NOTE — Telephone Encounter (Signed)
Allen Savage is calling to give fax number to [employer].  Fax: [phone].  Please advise

## 2018-08-12 NOTE — Telephone Encounter (Signed)
Added diagnoses to paperwork and re-faxed.  Patient has not had any recent surgeries.

## 2018-08-12 NOTE — Progress Notes (Signed)
Please inform patient of the following:  Prostate levels are normal. Blood counts are normal.  Electrolytes, kidney function, liver function, and blood sugar levels are normal. His "bad" cholesterol is a bit high but the rest of his levels are normal.  Do not need to make any adjustments to his treatment plan at this time, but he should continue working on diet and exercise and we can recheck in 1 year.   Katina Degreealeb M. Jimmey RalphParker, MD 08/12/2018 8:22 AM

## 2018-08-12 NOTE — Telephone Encounter (Signed)
Called and left voicemail for [employer].  FMLA was filled out on 08/03/2018 and left at front desk for patient to pick up because there was no fax number listed for us to fax to.  Unsure what is needed.  If fax number is needed, advised that [name] can leave when calling.  If she has additional questions, can ask those as well and I will return her call.  Also advised that I will be leaving the office today (08/12/2018) around 12:00 pm and will not be back until Tuesday (08/17/2018).  CRM placed.  A copy of the paperwork is located in the standing file to the left of my computer monitor if needed.

## 2018-08-31 ENCOUNTER — Telehealth: Payer: Self-pay | Admitting: Hematology

## 2018-08-31 ENCOUNTER — Inpatient Hospital Stay: Payer: Commercial Managed Care - PPO | Admitting: Oncology

## 2018-08-31 NOTE — Telephone Encounter (Signed)
Pt has been rescheduled to see Dr. Dion Body on 10/9 at 1pm. Pt agreed to the appt date and time.

## 2018-09-01 ENCOUNTER — Telehealth: Payer: Self-pay

## 2018-09-01 ENCOUNTER — Inpatient Hospital Stay: Payer: Commercial Managed Care - PPO | Attending: Hematology | Admitting: Hematology

## 2018-09-01 ENCOUNTER — Other Ambulatory Visit: Payer: Self-pay

## 2018-09-01 ENCOUNTER — Encounter: Payer: Self-pay | Admitting: Hematology

## 2018-09-01 VITALS — BP 133/96 | HR 91 | Temp 98.5°F | Resp 18 | Ht 69.0 in | Wt 195.9 lb

## 2018-09-01 DIAGNOSIS — Z789 Other specified health status: Secondary | ICD-10-CM

## 2018-09-01 DIAGNOSIS — Z7289 Other problems related to lifestyle: Secondary | ICD-10-CM | POA: Diagnosis not present

## 2018-09-01 DIAGNOSIS — I2699 Other pulmonary embolism without acute cor pulmonale: Secondary | ICD-10-CM | POA: Insufficient documentation

## 2018-09-01 DIAGNOSIS — Z79899 Other long term (current) drug therapy: Secondary | ICD-10-CM | POA: Diagnosis not present

## 2018-09-01 DIAGNOSIS — Z7901 Long term (current) use of anticoagulants: Secondary | ICD-10-CM | POA: Diagnosis not present

## 2018-09-01 DIAGNOSIS — I824Z1 Acute embolism and thrombosis of unspecified deep veins of right distal lower extremity: Secondary | ICD-10-CM | POA: Insufficient documentation

## 2018-09-01 MED ORDER — APIXABAN 5 MG PO TABS: 5.0000 mg | ORAL_TABLET | Freq: Two times a day (BID) | ORAL | 3 refills | Status: DC

## 2018-09-01 NOTE — Progress Notes (Signed)
[facility] CONSULT NOTE  Patient Care Team: Ardith Dark, MD as PCP - General (Family Medicine)  PROBLEM LIST: 1. 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 Acute RLE DVT involving popliteal vein, posterior tibial veins and peroneal veins in August 2019   ASSESSMENT:  1. Acute bilateral PTE and RLE DVT -I reviewed the patient's Savage, including recent ED visits. -I also independently reviewed the radiological images (CT angiogram of the chest) and agreed with the findings as documented in the report -In summary, CTA showed a large right pulmonary artery embolus involving the lobar branch of the middle and lower lobes as well as emboli in the segmental pulmonary arteries in the left lower lobe; in addition, Doppler showed extensive DVT in the right lower extremity involving the popliteal vein, posterior tibial veins, and peroneal veins -I reviewed with the patient about the plan for care for acute RLE and bilateral PTE  -This last episode of blood clot appeared to be provoked, but 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  -We discussed about the pros and cons about testing for thrombophilia disorder. His current anticoagulation therapy will interfere with some the tests and it is not possible to interpret the test results. Taking him off the anticoagulation therapy to do the tests may precipitate another thrombotic event. I do not see a reason to order additional testing to screen for thrombophilia disorder as it would not change our management. The goal of anticoagulation therapy is for life.  -Patient was started on Eliquis prior to discharge and has tolerated well so far without any significant side effects, such as abnormal bleeding or bruising. -Typical duration of anticoagulation is 3 to 6  months for provoked VTE; however, given extensive clot burden and concern for possible acute cor pulmonale (subsequent TTE negative for RV strain), if the patient has a recurrent VTE, the risk of severe adverse outcome is very high. -Therefore, I discussed with the patient about the benefits and some of the potential risks of lifetime anticoagulation.   -The patient expressed understanding and agreed with the plan to continue anticoagulation indefinitely. -Prescription for Eliquis 5mg  BID provided to the patient today. -Once the patient completes 1 year of therapeutic dosing, we can discuss possible dose reduction for secondary ppx.  -I recommend the patient to use elastic compression stockings at 20-30 mmHg to reduce risks of chronic thrombophlebitis. -Finally, at the end of our consultation today, I reinforced the importance of preventive strategies such as avoiding hormonal supplement, avoiding cigarette smoking, keeping up-to-date with screening programs for early cancer detection, frequent ambulation for long distance travel and aggressive DVT prophylaxis in all surgical settings. -Should he need any interruption of the anticoagulation for elective procedures in the future, feel free to contact me regarding peri-operative management.  EtOH Use -Patient reports 2-3 drinks (beer and liquor) every other night, sometimes more during sports events. -He denies driving under the influence.  -I informed the patient that alcohol use is associated with slight increased risk of VTE, and therefore I counseled the patient on reducing his alcohol use.   All questions were answered. The patient knows to call the clinic with any problems, questions or concerns.  A total of more than 45 minutes were spent face-to-face with the patient during this encounter and  over half of that time was spent on counseling and coordination of care as outlined above.    Arthur Holms, MD 09/01/2018 1:55 PM   CHIEF  COMPLAINTS/PURPOSE OF CONSULTATION:  "I am here for blood clot"  HISTORY OF PRESENTING ILLNESS:  Allen Savage 52 y.o. male is here because of acute bilateral PTE and RLE DVT.  The patient reports that approximately 1 week prior to his ER visit in end of August 2019, he developed new onset pain in the right lower extremity (in the right calf primarily).  The pain was dull, constant, nonradiating, and associated with mild swelling.  He denied any recent surgery, injury/trauma, being on testosterone supplement, or personal or family history of venous thromboembolism.  The night prior to the ER visit, he developed new onset sharp right-sided pleuritic chest pain, associated with dyspnea, prompting him to go to the ER for further evaluation.  In the ER, he was found with bilateral PTE (right > left) concern for possible acute cor pulmonale.  Echo subsequently showed normal cardiac function.  Doppler of lower extremities showed extensive DVT in the right lower extremity.  He was discharged home on Eliquis and has tolerated well so far without recurrent chest pain, dyspnea, or increasing lower extremity swelling/pain.  He does endorse that he has had occasional blood on the toilet paper and in the toilet bowl for the past 6 months (even prior to starting anticoagulation), but the stool itself does not appear red or black.  He has not had a colonoscopy in the past.  Since being on anticoagulation, the frequency and severity of hematochezia have not worsened.  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  MEDICAL HISTORY:  Past Medical History:  Diagnosis Date  . H/O shoulder surgery     SURGICAL HISTORY: Past Surgical History:  Procedure Laterality Date  . SHOULDER SURGERY Bilateral     SOCIAL HISTORY: Social History   Socioeconomic History  . Marital status: Legally Separated     Spouse name: Not on file  . Number of children: Not on file  . Years of education: Not on file  . Highest education level: Not on file  Occupational History  . Not on file  Social Needs  . Financial resource strain: Not on file  . Food insecurity:    Worry: Not on file    Inability: Not on file  . Transportation needs:    Medical: Not on file    Non-medical: Not on file  Tobacco Use  . Smoking status: Never Smoker  . Smokeless tobacco: Never Used  Substance and Sexual Activity  . Alcohol use: Yes    Alcohol/week: 1.0 standard drinks    Types: 1 Glasses of wine per week  . Drug use: No  . Sexual activity: Not on file  Lifestyle  . Physical activity:    Days per week: Not on file    Minutes per session: Not on file  . Stress: Not on file  Relationships  . Social connections:    Talks on phone: Not on file    Gets together: Not on file    Attends religious service: Not on file    Active member of club or organization: Not on file    Attends meetings of clubs or organizations: Not on file    Relationship status: Not on file  .  Intimate partner violence:    Fear of current or ex partner: Not on file    Emotionally abused: Not on file    Physically abused: Not on file    Forced sexual activity: Not on file  Other Topics Concern  . Not on file  Social History Narrative  . Not on file    FAMILY HISTORY: Family History  Problem Relation Age of Onset  . Diabetes Father     ALLERGIES:   has No Known Allergies.  MEDICATIONS:  Current Outpatient Medications  Medication Sig Dispense Refill  . amitriptyline (ELAVIL) 50 MG tablet Take 1 tablet (50 mg total) by mouth at bedtime. (Patient not taking: Reported on 09/01/2018) 90 tablet 3  . [START ON 09/24/2018] apixaban (ELIQUIS) 5 MG TABS tablet Take 1 tablet (5 mg total) by mouth 2 (two) times daily. 180 tablet 3   No current facility-administered medications for this visit.     REVIEW OF SYSTEMS:   Constitutional: ( -  ) fevers, ( - )  chills , ( - ) night sweats Eyes: ( - ) blurriness of vision, ( - ) double vision, ( - ) watery eyes Ears, nose, mouth, throat, and face: ( - ) mucositis, ( - ) sore throat Respiratory: ( - ) cough, ( - ) dyspnea, ( - ) wheezes Cardiovascular: ( - ) palpitation, ( - ) chest discomfort, ( - ) lower extremity swelling Gastrointestinal:  ( - ) nausea, ( - ) heartburn, ( - ) change in bowel habits Skin: ( - ) abnormal skin rashes Lymphatics: ( - ) new lymphadenopathy, ( - ) easy bruising Neurological: ( - ) numbness, ( - ) tingling, ( - ) new weaknesses Behavioral/Psych: ( - ) mood change, ( - ) new changes  All other systems were reviewed with the patient and are negative.  PHYSICAL EXAMINATION: ECOG PERFORMANCE STATUS: 0 - Asymptomatic  Vitals:   09/01/18 1302  BP: (!) 133/96  Pulse: 91  Resp: 18  Temp: 98.5 F (36.9 C)  SpO2: 100%   Filed Weights   09/01/18 1302  Weight: 195 lb 14.4 oz (88.9 kg)    GENERAL:alert, no distress and comfortable SKIN: skin color, texture, turgor are normal, no rashes or significant lesions EYES: normal, conjunctiva are pink and non-injected, sclera clear OROPHARYNX:no exudate, no erythema and lips, buccal mucosa, and tongue normal  NECK: supple, thyroid normal size, non-tender, without nodularity LYMPH:  no palpable lymphadenopathy in the cervical or axillary LUNGS: clear to auscultation and percussion with normal breathing effort HEART: regular rate & rhythm and no murmurs and no lower extremity edema ABDOMEN:abdomen soft, non-tender and normal bowel sounds Musculoskeletal:no cyanosis of digits and no clubbing  PSYCH: alert & oriented x 3 with fluent speech NEURO: no focal motor/sensory deficits  LABORATORY DATA:  I have reviewed the data as listed Lab Results  Component Value Date   WBC 7.0 08/10/2018   HGB 16.4 08/10/2018   HCT 49.4 08/10/2018   MCV 93.3 08/10/2018   PLT 258.0 08/10/2018   Lab Results  Component  Value Date   NA 139 08/10/2018   K 4.3 08/10/2018   CL 105 08/10/2018   CO2 30 08/10/2018    RADIOGRAPHIC STUDIES: I have personally reviewed the radiological images as listed and agreed with the findings in the report.

## 2018-09-01 NOTE — Telephone Encounter (Signed)
Printed avs, and called [name] concerning scheduling the patient a appointment. Per 10/9 los

## 2018-09-10 ENCOUNTER — Encounter: Payer: Self-pay | Admitting: Physician Assistant

## 2018-09-15 ENCOUNTER — Ambulatory Visit: Payer: Self-pay | Admitting: Physician Assistant

## 2018-09-15 DIAGNOSIS — Z0279 Encounter for issue of other medical certificate: Secondary | ICD-10-CM

## 2018-09-23 ENCOUNTER — Ambulatory Visit (INDEPENDENT_AMBULATORY_CARE_PROVIDER_SITE_OTHER): Payer: Commercial Managed Care - PPO | Admitting: Family Medicine

## 2018-09-23 ENCOUNTER — Other Ambulatory Visit: Payer: Self-pay

## 2018-09-23 ENCOUNTER — Ambulatory Visit (HOSPITAL_COMMUNITY)
Admission: RE | Admit: 2018-09-23 | Discharge: 2018-09-23 | Disposition: A | Discharge status: Home / Self Care | Payer: Commercial Managed Care - PPO | Source: Ambulatory Visit | Attending: Family Medicine | Admitting: Family Medicine

## 2018-09-23 ENCOUNTER — Encounter: Payer: Self-pay | Admitting: Family Medicine

## 2018-09-23 VITALS — BP 138/82 | HR 111 | Temp 98.8°F | Ht 69.0 in | Wt 190.0 lb

## 2018-09-23 DIAGNOSIS — Z7901 Long term (current) use of anticoagulants: Secondary | ICD-10-CM | POA: Diagnosis not present

## 2018-09-23 DIAGNOSIS — R0602 Shortness of breath: Secondary | ICD-10-CM | POA: Diagnosis not present

## 2018-09-23 DIAGNOSIS — I824Z9 Acute embolism and thrombosis of unspecified deep veins of unspecified distal lower extremity: Secondary | ICD-10-CM

## 2018-09-23 DIAGNOSIS — M79605 Pain in left leg: Secondary | ICD-10-CM

## 2018-09-23 DIAGNOSIS — M79604 Pain in right leg: Secondary | ICD-10-CM

## 2018-09-23 DIAGNOSIS — Z86718 Personal history of other venous thrombosis and embolism: Secondary | ICD-10-CM | POA: Diagnosis not present

## 2018-09-23 DIAGNOSIS — R000 Tachycardia, unspecified: Secondary | ICD-10-CM

## 2018-09-23 LAB — COMPREHENSIVE METABOLIC PANEL
ALBUMIN: 4 g/dL (ref 3.5–5.2)
ALT: 15 U/L (ref 0–53)
AST: 25 U/L (ref 0–37)
Alkaline Phosphatase: 66 U/L (ref 39–117)
BILIRUBIN TOTAL: 0.6 mg/dL (ref 0.2–1.2)
BUN: 11 mg/dL (ref 6–23)
CALCIUM: 9.3 mg/dL (ref 8.4–10.5)
CO2: 28 meq/L (ref 19–32)
Chloride: 102 mEq/L (ref 96–112)
Creatinine, Ser: 0.92 mg/dL (ref 0.40–1.50)
GFR: 111.93 mL/min (ref >60.00)
Glucose, Bld: 109 mg/dL — ABNORMAL HIGH (ref 70–99)
Potassium: 4.2 mEq/L (ref 3.5–5.1)
Sodium: 137 mEq/L (ref 135–145)
Total Protein: 7.1 g/dL (ref 6.0–8.3)

## 2018-09-23 LAB — CBC
HCT: 49.2 % (ref 39.0–52.0)
Hemoglobin: 16.4 g/dL (ref 13.0–17.0)
MCHC: 33.4 g/dL (ref 30.0–36.0)
MCV: 91.1 fl (ref 78.0–100.0)
PLATELETS: 181 10*3/uL (ref 150.0–400.0)
RBC: 5.4 Mil/uL (ref 4.22–5.81)
RDW: 16.4 % — ABNORMAL HIGH (ref 11.5–15.5)
WBC: 5.3 10*3/uL (ref 4.0–10.5)

## 2018-09-23 NOTE — Progress Notes (Signed)
   Subjective:  Allen Savage is a 52 y.o. male who presents today with a chief complaint of leg pain.   HPI:  Leg Pain, new problem Started a few weeks ago. Located in right calf and left thigh. Symptoms feel very similar to when he first was diagnosed with a DVT about 2 months ago.  He has also had a small amount of shortness of breath that is worse with exertion.  No recent illnesses.  No fevers or chills.  He has been compliant with Eliquis 5 mg twice daily.  No reported missed doses.  No other obvious alleviating or aggravating factors.   ROS: Per HPI  PMH: He reports that he has never smoked. He has never used smokeless tobacco. He reports that he drinks about 1.0 standard drinks of alcohol per week. He reports that he does not use drugs.  Objective:  Physical Exam: BP 138/82 (BP Location: Left Arm, Patient Position: Sitting, Cuff Size: Normal)   Pulse (!) 111   Temp 98.8 F (37.1 C) (Oral)   Ht 5\' 9"  (1.753 m)   Wt 190 lb (86.2 kg)   SpO2 97%   BMI 28.06 kg/m   Gen: NAD, resting comfortably CV: Tachycardic.  Normal S1 and S2.  No murmurs. Pulm: NWOB, CTAB with no crackles, wheezes, or rhonchi MSK: -Right lower extremity: No deformities.  No edema.  Tender to palpation on posterior calf. -Left lower extremity: No deformities.  No edema.  Tender to palpation along the left anterior thigh.  Assessment/Plan:  Bilateral leg pain Concern for recurrent DVT.  He is mildly tachycardic today but his vital signs are otherwise within normal limits.  He appears stable.  Check stat CBC and CMP.  Check stat lower extremity Doppler rule out recurrent DVT.  Continue Eliquis 5 mg twice daily.  Discussed reasons to return to care or seek emergent care.  Katina Degree. [name], MD 09/23/2018 11:57 AM

## 2018-09-23 NOTE — Progress Notes (Signed)
Bilateral lower extremity venous duplex completed. Preliminary results - Right - Dvt still present in the distal popliteal, posterior tibial, and peroneal veins. Left - There is no evidence of a DVT. Bilateral - Thre is no evidence of a Baker's cyst. Toma Deiters, RVS 09/23/2018,3:26 PM

## 2018-09-23 NOTE — Patient Instructions (Signed)
It was very nice to see you today!  We will check blood work today and set you up to have a scan of your legs to make sure you dont have a blood clot.  We will contact you with the results as soon as they are available.   Please go to the ED if you have worsening shortness of breath or chest pain.   Take care, Dr [name]

## 2018-09-23 NOTE — Progress Notes (Signed)
Please inform patient of the following:  His blood work is normal. The report for his ultrasound shows that the clot is DECREASING in size, but still present.  His pain is most likely muscular. Would like for him to take tylenol 1000mg  three times daily as needed. Pain should improve over the next several days.  Would like for him to let us know if his symptoms worsen or fail to improve over the next 1-2 weeks.  Allen Savage. [name], MD 09/23/2018 3:32 PM

## 2018-09-29 ENCOUNTER — Telehealth: Payer: Self-pay | Admitting: Family Medicine

## 2018-09-29 NOTE — Telephone Encounter (Signed)
Copied from CRM [id number]. Topic: Quick Communication - See Telephone Encounter >> Sep 29, 2018  1:40 PM Herby Abraham C wrote: CRM for notification. See Telephone encounter for: 09/29/18.  Pt called in stating that he just had paperwork faxed for his disability by provider. Pt says that they are now requesting to have a copy of his most recent med records faxed to them also for pt's disability.    Please assist.  CB: [phone]

## 2018-09-30 NOTE — Telephone Encounter (Signed)
Most recent office note faxed to patient's disability provider.

## 2018-10-26 ENCOUNTER — Telehealth: Payer: Self-pay | Admitting: *Deleted

## 2018-10-26 NOTE — Telephone Encounter (Signed)
Copied from CRM [id number]. Topic: General - Inquiry >> Oct 26, 2018 11:38 AM Angela NevinWilliams, Candice N wrote: Reason for CRM: Patient called inquiring if he could get an extension of medical leave-patient states he feels like he is unable to go back to work. Patient is not scheduled for follow up appointment. Patient is requesting a call back to discuss further. Please advise.

## 2018-10-27 NOTE — Telephone Encounter (Signed)
Patient called back checking on the status of his request.  Please call.

## 2018-10-27 NOTE — Telephone Encounter (Signed)
Please advise 

## 2018-10-28 NOTE — Telephone Encounter (Signed)
Would like for him to come in for a visit to discuss. Ideally, he should contact his HR department before our appointment to make sure he has all the documents needed.  Katina Degreealeb M. Jimmey RalphParker, MD 10/28/2018 7:42 PM

## 2018-10-28 NOTE — Telephone Encounter (Signed)
See note

## 2018-10-28 NOTE — Telephone Encounter (Signed)
Pt calling back to check status. Pt states that his return date to work was yesterday 10/27/18. Pt states that he is requesting another 3 months. Cb#[phone]

## 2018-10-29 NOTE — Telephone Encounter (Signed)
Notified patient and voices understanding. Patient will contact HR and call back to schedule appointment.

## 2018-10-29 NOTE — Telephone Encounter (Signed)
Patient states that he is not requesting FMLA paper work. He is requesting Dr. Jimmey RalphParker to write him out of work until he is ready to go back to work. Please advise [phone]

## 2018-11-01 NOTE — Telephone Encounter (Signed)
Patient has been scheduled for an appointment.

## 2018-11-04 ENCOUNTER — Encounter: Payer: Self-pay | Admitting: Family Medicine

## 2018-11-04 ENCOUNTER — Ambulatory Visit (INDEPENDENT_AMBULATORY_CARE_PROVIDER_SITE_OTHER): Payer: Commercial Managed Care - PPO | Admitting: Family Medicine

## 2018-11-04 VITALS — BP 118/76 | HR 82 | Temp 98.2°F | Ht 69.0 in | Wt 200.8 lb

## 2018-11-04 DIAGNOSIS — I824Z1 Acute embolism and thrombosis of unspecified deep veins of right distal lower extremity: Secondary | ICD-10-CM

## 2018-11-04 NOTE — Progress Notes (Signed)
   Subjective:  Allen Savage is a 52 y.o. male who presents today with a chief complaint of bilateral DVT.   HPI:  Bilateral DVT, chronic problem Last seen about 6 weeks ago for this.  He was having severe right lower extremity pain at that point.  We obtained stat Doppler which showed stable DVT with possible mild improvement compared to scan 2 months prior.  He has had continued pain to the area.  Pain comes and goes.  No chest pain or shortness of breath.  He was scheduled to go back to work earlier this month, however has not been able to due to the pain.  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  ROS: Per HPI  Objective:  Physical Exam: BP 118/76 (BP Location: Left Arm, Patient Position: Sitting, Cuff Size: Large)   Pulse 82   Temp 98.2 F (36.8 C) (Oral)   Ht 5\' 9"  (1.753 m)   Wt 200 lb 12 oz (91.1 kg)   SpO2 99%   BMI 29.65 kg/m   Gen: NAD, resting comfortably CV: RRR with no murmurs appreciated Pulm: NWOB, CTAB with no crackles, wheezes, or rhonchi MSK: -Right lower extremity: No deformities.  Nontender to palpation along gastrocnemius.  Assessment/Plan:  Deep vein thrombosis (DVT) of distal vein of lower extremity (HCC) We will continue Eliquis for the time being.  Patient is reportedly not able to go to work due to his symptoms.  Given that he has not had clear resolution of his DVT based on objective findings, I think it is reasonable to write him out of work until his DVT is objectively resolved.  We will check lower extremity Doppler to monitor progress.  Work excuse was given.  He will be contacting his HR department to look into short-term disability extension.  Time Spent: I spent >15 minutes face-to-face with the patient, with more than half spent on counseling for management plan for his DVT/PE.  Katina Degreealeb M. Jimmey RalphParker, MD 11/04/2018 2:04 PM

## 2018-11-04 NOTE — Assessment & Plan Note (Signed)
We will continue Eliquis for the time being.  Patient is reportedly not able to go to work due to his symptoms.  Given that he has not had clear resolution of his DVT based on objective findings, I think it is reasonable to write him out of work until his DVT is objectively resolved.  We will check lower extremity Doppler to monitor progress.  Work excuse was given.  He will be contacting his HR department to look into short-term disability extension.

## 2018-11-04 NOTE — Patient Instructions (Signed)
It was very nice to see you today!  Please continue the elliquis.  We need to get an ultrasound of your legs to make sure that the clot has gone away.  Please contact your HR department about extending your FMLA or short-term disability.  We will contact you with results of the ultrasound once they are available.  Take care, Dr Jimmey RalphParker

## 2018-11-05 ENCOUNTER — Ambulatory Visit: Payer: Self-pay | Admitting: Family Medicine

## 2018-11-09 ENCOUNTER — Telehealth: Payer: Self-pay

## 2018-11-09 NOTE — Telephone Encounter (Signed)
Patient's STD extension form was filled out, signed by Dr. Jimmey RalphParker and faxed to [employer] on 11/09/2018.

## 2018-11-30 ENCOUNTER — Encounter: Payer: Self-pay | Admitting: Family Medicine

## 2018-11-30 ENCOUNTER — Ambulatory Visit: Payer: Commercial Managed Care - PPO | Admitting: Family Medicine

## 2018-11-30 VITALS — BP 120/74 | HR 104 | Temp 98.3°F | Ht 69.0 in | Wt 196.6 lb

## 2018-11-30 DIAGNOSIS — I824Z1 Acute embolism and thrombosis of unspecified deep veins of right distal lower extremity: Secondary | ICD-10-CM

## 2018-11-30 DIAGNOSIS — M79662 Pain in left lower leg: Secondary | ICD-10-CM

## 2018-11-30 DIAGNOSIS — M79661 Pain in right lower leg: Secondary | ICD-10-CM | POA: Diagnosis not present

## 2018-11-30 NOTE — Patient Instructions (Signed)
It was very nice to see you today!  We need to get an ultrasound to make sure the blood clot is improving.  We may need to get you back in to the hematologist or have you see sports medicine if your symptoms depending on the results of your ultrasound.  Please talk to a lawyer about disability.   Take care, Dr [name]

## 2018-11-30 NOTE — Progress Notes (Signed)
   Subjective:  Allen Savage is a 53 y.o. male who presents today with a chief complaint of leg pain.   HPI:  Leg Pain, chronic problem Patient reports having bilateral calf pain for the past several months.  He was diagnosed with a DVT a little over 4 months ago and since then has had pain in his calf.  He is also had some associated numbness and tingling in his posterior calf as well.  Symptoms seem to worse when he is sitting for long periods of time.  Overall, symptoms seem to be stable.  He is compliant with his Eliquis with no reported side effects.  No back pain.  No fevers or chills.  No nausea or vomiting.  Patient is currently out of work on short-term disability.  He inquired about potential for long-term or permanent disability today.  ROS: Per HPI  PMH: He reports that he has never smoked. He has never used smokeless tobacco. He reports current alcohol use of about 1.0 standard drinks of alcohol per week. He reports that he does not use drugs.  Objective:  Physical Exam: BP 120/74 (BP Location: Left Arm, Patient Position: Sitting, Cuff Size: Normal)   Pulse (!) 104   Temp 98.3 F (36.8 C) (Oral)   Ht 5\' 9"  (1.753 m)   Wt 196 lb 9.6 oz (89.2 kg)   SpO2 97%   BMI 29.03 kg/m   Gen: NAD, resting comfortably CV: RRR with no murmurs appreciated Pulm: NWOB, CTAB with no crackles, wheezes, or rhonchi GI: Normal bowel sounds present. Soft, Nontender, Nondistended. MSK:  -Lower extremities: No deformities.  Distal pulses intact.  Tender to palpation along proximal gastrocnemius bilaterally.  Homans sign negative.  Strength 5 out of 5 in all directions.  Sensation light touch intact throughout.  Assessment/Plan:  Bilateral calf pain Unclear if this is secondary to his DVT or actual muscular etiology.  We will recheck his lower extremity Dopplers to make sure that his DVT is resolving and not worsening.  Depending on results, would consider referral to sports medicine if not seem  to be resolving or hematology if clot is progressing or not resolving.  Discussed disability with patient.  Advised that it would be very unlikely that he would be approved for long-term disability based on DVT alone.  Recommended that he follow-up with a lawyer to discuss further if he wishes.  Time Spent: I spent >25 minutes face-to-face with the patient, with more than half spent on coordinating care and counseling for management plan for his DVT and bilateral calf pain.   Katina Degreealeb M. Jimmey RalphParker, MD 11/30/2018 5:08 PM

## 2018-12-02 ENCOUNTER — Other Ambulatory Visit: Payer: Self-pay

## 2018-12-02 DIAGNOSIS — I824Z1 Acute embolism and thrombosis of unspecified deep veins of right distal lower extremity: Secondary | ICD-10-CM

## 2018-12-08 ENCOUNTER — Ambulatory Visit (HOSPITAL_COMMUNITY)
Admission: RE | Admit: 2018-12-08 | Discharge: 2018-12-08 | Disposition: A | Discharge status: Home / Self Care | Payer: Commercial Managed Care - PPO | Source: Ambulatory Visit | Attending: Family Medicine | Admitting: Family Medicine

## 2018-12-08 DIAGNOSIS — I824Z1 Acute embolism and thrombosis of unspecified deep veins of right distal lower extremity: Secondary | ICD-10-CM | POA: Insufficient documentation

## 2018-12-09 NOTE — Progress Notes (Signed)
Notified patient of results and voices understanding. 

## 2018-12-09 NOTE — Progress Notes (Signed)
Please inform patient of the following:  He still has a blood clot, but it looks like it is gradually shrinking in size. Do not think this is causing his worsening pain as it seems to be improving. Recommend sports med referral if he is still having significant pain.  Katina Degree. [name], MD 12/09/2018 1:32 PM

## 2018-12-31 ENCOUNTER — Ambulatory Visit (INDEPENDENT_AMBULATORY_CARE_PROVIDER_SITE_OTHER): Payer: Commercial Managed Care - PPO | Admitting: Physician Assistant

## 2018-12-31 ENCOUNTER — Encounter: Payer: Self-pay | Admitting: Physician Assistant

## 2018-12-31 VITALS — BP 110/70 | HR 98 | Temp 97.9°F | Ht 69.0 in | Wt 200.0 lb

## 2018-12-31 DIAGNOSIS — I824Z1 Acute embolism and thrombosis of unspecified deep veins of right distal lower extremity: Secondary | ICD-10-CM | POA: Diagnosis not present

## 2018-12-31 NOTE — Assessment & Plan Note (Signed)
Continue Eliquis for the time being. He continues to report that he is not able to return to work due to his symptoms. PCP is currently working on trying to ensure objective resolution of DVT for work release. He is reportedly to return to work on 01/05/19. I am going to order doppler to monitor progress for Monday, 01/03/19, under PCP's name so results will go to him. Patient verbalized understanding to plan. Any worsening symptoms, patient was instructed to go to the ER.

## 2018-12-31 NOTE — Progress Notes (Addendum)
Allen Savage is a 53 y.o. male is here to follow up on DVT.  I acted as a Neurosurgeonscribe for Energy East CorporationSamantha Kerston Landeck, PA-C Corky Mullonna Orphanos, LPN  History of Present Illness:   Chief Complaint  Patient presents with  . Follow up on DVT    RLE    HPI  Leg pain Pt here for follow up on DVT Right lower extremity, diagnosed in August 2019. Pt having pain off and on RLE, pain medication not needed. Numbness and Tingling off and on, but overall pt feels it is getting better. Last seen by his PCP on 11/30/18.  Last u/s was done on 12/08/2018 which showed chronic DVT in R popliteal vein.   He reports that he has been compliant with his blood thinner, Eliquis 5 mg BID.  Denies: chest pain, SOB, palpitations, fevers, DOE   Health Maintenance Due  Topic Date Due  . TETANUS/TDAP  08/07/1987  . COLONOSCOPY  08/06/2018    Past Medical History:  Diagnosis Date  . H/O shoulder surgery      Social History   Socioeconomic History  . Marital status: Legally Separated    Spouse name: Not on file  . Number of children: Not on file  . Years of education: Not on file  . Highest education level: Not on file  Occupational History  . Not on file  Social Needs  . Financial resource strain: Not on file  . Food insecurity:    Worry: Not on file    Inability: Not on file  . Transportation needs:    Medical: Not on file    Non-medical: Not on file  Tobacco Use  . Smoking status: Never Smoker  . Smokeless tobacco: Never Used  Substance and Sexual Activity  . Alcohol use: Yes    Alcohol/week: 1.0 standard drinks    Types: 1 Glasses of wine per week  . Drug use: No  . Sexual activity: Not on file  Lifestyle  . Physical activity:    Days per week: Not on file    Minutes per session: Not on file  . Stress: Not on file  Relationships  . Social connections:    Talks on phone: Not on file    Gets together: Not on file    Attends religious service: Not on file    Active member of club or organization:  Not on file    Attends meetings of clubs or organizations: Not on file    Relationship status: Not on file  . Intimate partner violence:    Fear of current or ex partner: Not on file    Emotionally abused: Not on file    Physically abused: Not on file    Forced sexual activity: Not on file  Other Topics Concern  . Not on file  Social History Narrative  . Not on file    Past Surgical History:  Procedure Laterality Date  . SHOULDER SURGERY Bilateral     Family History  Problem Relation Age of Onset  . Diabetes Father     PMHx, SurgHx, SocialHx, FamHx, Medications, and Allergies were reviewed in the Visit Navigator and updated as appropriate.   Patient Active Problem List   Diagnosis Date Noted  . Dyslipidemia 08/12/2018  . Deep vein thrombosis (DVT) of distal vein of lower extremity (HCC) 07/29/2018  . Premature ejaculation 07/29/2018  . Pulmonary emboli (HCC) 07/20/2018    Social History   Tobacco Use  . Smoking status: Never Smoker  .  Smokeless tobacco: Never Used  Substance Use Topics  . Alcohol use: Yes    Alcohol/week: 1.0 standard drinks    Types: 1 Glasses of wine per week  . Drug use: No    Current Medications and Allergies:    Current Outpatient Medications:  .  amitriptyline (ELAVIL) 50 MG tablet, Take 1 tablet (50 mg total) by mouth at bedtime., Disp: 90 tablet, Rfl: 3 .  apixaban (ELIQUIS) 5 MG TABS tablet, Take 1 tablet (5 mg total) by mouth 2 (two) times daily., Disp: 180 tablet, Rfl: 3  No Known Allergies  Review of Systems   Review of Systems  Constitutional: Negative for chills, fever, malaise/fatigue and weight loss.  Respiratory: Negative for shortness of breath.   Cardiovascular: Negative for chest pain, orthopnea, claudication and leg swelling.  Gastrointestinal: Negative for heartburn, nausea and vomiting.  Musculoskeletal: Positive for joint pain (RLE pain).  Neurological: Negative for dizziness, tingling and headaches.    Vitals:    Vitals:   12/31/18 1107  BP: 110/70  Pulse: 98  Temp: 97.9 F (36.6 C)  TempSrc: Oral  SpO2: 97%  Weight: 200 lb (90.7 kg)  Height: 5\' 9"  (1.753 m)     Body mass index is 29.53 kg/m.   Physical Exam:    Physical Exam Vitals signs and nursing note reviewed.  Constitutional:      General: He is not in acute distress.    Appearance: He is well-developed. He is not ill-appearing or toxic-appearing.  Cardiovascular:     Rate and Rhythm: Normal rate and regular rhythm.     Pulses: Normal pulses.          Dorsalis pedis pulses are 2+ on the right side and 2+ on the left side.     Heart sounds: Normal heart sounds, S1 normal and S2 normal.  Pulmonary:     Effort: Pulmonary effort is normal.     Breath sounds: Normal breath sounds.  Musculoskeletal:     Comments: Bilateral LE with trace edema.  No calf tenderness bilaterally. Negative Homan's sign bilaterally. No obvious swelling of bilateral calf muscles.  Skin:    General: Skin is warm and dry.  Neurological:     Mental Status: He is alert.     GCS: GCS eye subscore is 4. GCS verbal subscore is 5. GCS motor subscore is 6.  Psychiatric:        Speech: Speech normal.        Behavior: Behavior normal. Behavior is cooperative.     Assessment and Plan:    Deep vein thrombosis (DVT) of distal vein of lower extremity (HCC) Continue Eliquis for the time being. He continues to report that he is not able to return to work due to his symptoms. PCP is currently working on trying to ensure objective resolution of DVT for work release. He is reportedly to return to work on 01/05/19. I am going to order doppler to monitor progress for Monday, 01/03/19, under PCP's name so results will go to him. Patient verbalized understanding to plan. Any worsening symptoms, patient was instructed to go to the ER.  We were able to secure appointment for doppler at 3pm on 01/03/2019.   Marland Kitchen. Reviewed expectations re: course of current medical  issues. . Discussed self-management of symptoms. . Outlined signs and symptoms indicating need for more acute intervention. . Patient verbalized understanding and all questions were answered. . See orders for this visit as documented in the electronic medical record. . Patient  received an After Visit Summary.  CMA or LPN served as scribe during this visit. History, Physical, and Plan performed by medical provider. The above documentation has been reviewed and is accurate and complete.  Jarold Motto, PA-C [place], [place] 01/02/2019  Follow-up: No follow-ups on file.

## 2018-12-31 NOTE — Patient Instructions (Addendum)
It was great to see you!  Please obtain ultrasound as scheduled.  We will notify you of your results after the ultrasound is obtained. We will notify you of any further recommendations as well.  If any worsening symptoms, please go to the ER.  .Marland Kitchen

## 2019-01-03 ENCOUNTER — Other Ambulatory Visit: Payer: Self-pay | Admitting: Family Medicine

## 2019-01-03 ENCOUNTER — Ambulatory Visit (HOSPITAL_COMMUNITY)
Admission: RE | Admit: 2019-01-03 | Discharge: 2019-01-03 | Disposition: A | Discharge status: Home / Self Care | Payer: Commercial Managed Care - PPO | Source: Ambulatory Visit | Attending: Family Medicine | Admitting: Family Medicine

## 2019-01-03 DIAGNOSIS — I82531 Chronic embolism and thrombosis of right popliteal vein: Secondary | ICD-10-CM

## 2019-01-03 NOTE — Progress Notes (Signed)
RLE venous duplex results  Preliminary   Chronic DVT right popliteal vein. Same as previous study 11/2018.  Chronic DVT = minimal residual clot, non obstructing, most likely permanent.    Jeb LeveringJill Teja Judice, BS, RDMS, RVT

## 2019-01-06 ENCOUNTER — Telehealth: Payer: Self-pay | Admitting: Family Medicine

## 2019-01-06 NOTE — Telephone Encounter (Signed)
His DVT has transitioned into a chronic DVT. It is no longer an acute DVT.   This no longer should prevent him from going back to work. I am ok with releasing him to go back. Recommend vascular referral if he is still having pain in the area.  Katina Degree. [name], MD 01/06/2019 1:02 PM

## 2019-01-06 NOTE — Telephone Encounter (Signed)
Please advise 

## 2019-01-06 NOTE — Progress Notes (Signed)
Please inform patient of the following:  His scan showed that he still has a small amount of residual clot. If he is still having pain would recommend referral to vascular surgery.  Allen Savage. Allen Ralph, MD 01/06/2019 8:12 AM

## 2019-01-06 NOTE — Telephone Encounter (Signed)
Patient states he's suppose to go back to work, needs to know if he can go back, or if he has released him to work. Per [facility]

## 2019-01-10 ENCOUNTER — Other Ambulatory Visit: Payer: Self-pay

## 2019-01-10 DIAGNOSIS — I825Z1 Chronic embolism and thrombosis of unspecified deep veins of right distal lower extremity: Secondary | ICD-10-CM

## 2019-01-10 NOTE — Telephone Encounter (Signed)
See note

## 2019-01-10 NOTE — Telephone Encounter (Signed)
Pt called again to follow up on Dr. Lavone Neri findings. Would like to speak with someone in office in case of questions. Please advise.

## 2019-01-11 NOTE — Telephone Encounter (Signed)
Spoke with patient gave information he requested letter that he is clare to go back to work. Informed him I will get ready and he can pick up tomorrow.

## 2019-01-12 NOTE — Telephone Encounter (Signed)
Noted  

## 2019-02-22 ENCOUNTER — Telehealth: Payer: Self-pay | Admitting: Family Medicine

## 2019-02-22 NOTE — Telephone Encounter (Signed)
Copied from CRM [id number]. Topic: General - Other >> Feb 22, 2019  2:46 PM Herby Abraham C wrote: Reason for CRM: pt called in requesting a work note excusing him from work due to his current health. 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    Please advise.

## 2019-02-22 NOTE — Telephone Encounter (Signed)
Please advise 

## 2019-02-23 NOTE — Telephone Encounter (Signed)
Can we call the patient to get more info about what he needs?  His history of DVT and PE is not a risk factor for severe COVID.  He will not be able to be excused from work due to this alone.  Katina Degree. [name], MD 02/23/2019 8:33 AM

## 2019-02-25 NOTE — Telephone Encounter (Signed)
Spoke with patient and advised per Dr. [name]. Offered virtual visit to discuss health concerns, pt declines at this time.

## 2019-08-12 ENCOUNTER — Ambulatory Visit (INDEPENDENT_AMBULATORY_CARE_PROVIDER_SITE_OTHER): Payer: Commercial Managed Care - PPO | Admitting: Family Medicine

## 2019-08-12 ENCOUNTER — Other Ambulatory Visit: Payer: Self-pay

## 2019-08-12 ENCOUNTER — Encounter: Payer: Self-pay | Admitting: Family Medicine

## 2019-08-12 VITALS — BP 137/87 | HR 62 | Temp 97.7°F | Ht 69.0 in | Wt 209.2 lb

## 2019-08-12 DIAGNOSIS — Z13220 Encounter for screening for lipoid disorders: Secondary | ICD-10-CM | POA: Diagnosis not present

## 2019-08-12 DIAGNOSIS — Z0001 Encounter for general adult medical examination with abnormal findings: Secondary | ICD-10-CM

## 2019-08-12 DIAGNOSIS — R3915 Urgency of urination: Secondary | ICD-10-CM

## 2019-08-12 DIAGNOSIS — I825Z1 Chronic embolism and thrombosis of unspecified deep veins of right distal lower extremity: Secondary | ICD-10-CM

## 2019-08-12 DIAGNOSIS — F524 Premature ejaculation: Secondary | ICD-10-CM | POA: Diagnosis not present

## 2019-08-12 DIAGNOSIS — Z1211 Encounter for screening for malignant neoplasm of colon: Secondary | ICD-10-CM

## 2019-08-12 DIAGNOSIS — Z6830 Body mass index (BMI) 30.0-30.9, adult: Secondary | ICD-10-CM

## 2019-08-12 DIAGNOSIS — E669 Obesity, unspecified: Secondary | ICD-10-CM

## 2019-08-12 DIAGNOSIS — Z125 Encounter for screening for malignant neoplasm of prostate: Secondary | ICD-10-CM | POA: Diagnosis not present

## 2019-08-12 DIAGNOSIS — I87009 Postthrombotic syndrome without complications of unspecified extremity: Secondary | ICD-10-CM

## 2019-08-12 LAB — TSH: TSH: 1.89 u[IU]/mL (ref 0.35–4.50)

## 2019-08-12 LAB — CBC
HCT: 50.8 % (ref 39.0–52.0)
Hemoglobin: 16.8 g/dL (ref 13.0–17.0)
MCHC: 33.1 g/dL (ref 30.0–36.0)
MCV: 94.2 fl (ref 78.0–100.0)
Platelets: 187 10*3/uL (ref 150.0–400.0)
RBC: 5.39 Mil/uL (ref 4.22–5.81)
RDW: 14 % (ref 11.5–15.5)
WBC: 6 10*3/uL (ref 4.0–10.5)

## 2019-08-12 LAB — PSA: PSA: 1.85 ng/mL (ref 0.10–4.00)

## 2019-08-12 LAB — POC URINALSYSI DIPSTICK (AUTOMATED)
Bilirubin, UA: NEGATIVE
Blood, UA: NEGATIVE
Glucose, UA: NEGATIVE
Ketones, UA: NEGATIVE
Leukocytes, UA: NEGATIVE
Nitrite, UA: NEGATIVE
Protein, UA: NEGATIVE
Spec Grav, UA: 1.015 (ref 1.010–1.025)
Urobilinogen, UA: 0.2 E.U./dL
pH, UA: 6 (ref 5.0–8.0)

## 2019-08-12 LAB — COMPREHENSIVE METABOLIC PANEL
ALT: 11 U/L (ref 0–53)
AST: 14 U/L (ref 0–37)
Albumin: 3.8 g/dL (ref 3.5–5.2)
Alkaline Phosphatase: 63 U/L (ref 39–117)
BUN: 9 mg/dL (ref 6–23)
CO2: 29 mEq/L (ref 19–32)
Calcium: 9.7 mg/dL (ref 8.4–10.5)
Chloride: 102 mEq/L (ref 96–112)
Creatinine, Ser: 0.79 mg/dL (ref 0.40–1.50)
GFR: 125.11 mL/min (ref >60.00)
Glucose, Bld: 98 mg/dL (ref 70–99)
Potassium: 4.7 mEq/L (ref 3.5–5.1)
Sodium: 137 mEq/L (ref 135–145)
Total Bilirubin: 0.5 mg/dL (ref 0.2–1.2)
Total Protein: 6.3 g/dL (ref 6.0–8.3)

## 2019-08-12 LAB — LIPID PANEL
Cholesterol: 190 mg/dL (ref 0–200)
HDL: 41.4 mg/dL (ref >39.00)
LDL Cholesterol: 133 mg/dL — ABNORMAL HIGH (ref 0–99)
NonHDL: 149.05
Total CHOL/HDL Ratio: 5
Triglycerides: 79 mg/dL (ref 0.0–149.0)
VLDL: 15.8 mg/dL (ref 0.0–40.0)

## 2019-08-12 NOTE — Progress Notes (Signed)
Chief Complaint:  Allen Savage is a 53 y.o. male who presents today for his annual comprehensive physical exam.    Assessment/Plan:  Premature ejaculation Has tried and failed several oral medications including several SSRIs and a TCA.  Will place referral to urology for further management.  Deep vein thrombosis (DVT) of distal vein of lower extremity (HCC) Continue Eliquis 5 mg twice daily.  He is having some symptoms concerning for post thrombotic syndrome.  Will place referral to vascular surgery for further evaluation.  Urinary frequency Likely due to BPH.  UA negative.  Will place referral to urology.  Body mass index is 30.89 kg/m. / Obese BMI Metric Follow Up - 08/12/19 1422      BMI Metric Follow Up-Please document annually   BMI Metric Follow Up  Education provided       Preventative Healthcare: Will place referral for colonoscopy.  Flu vaccine declined.  Check CBC, C met, TSH, and PSA.  Patient Counseling(The following topics were reviewed and/or handout was given):  -Nutrition: Stressed importance of moderation in sodium/caffeine intake, saturated fat and cholesterol, caloric balance, sufficient intake of fresh fruits, vegetables, and fiber.  -Stressed the importance of regular exercise.   -Substance Abuse: Discussed cessation/primary prevention of tobacco, alcohol, or other drug use; driving or other dangerous activities under the influence; availability of treatment for abuse.   -Injury prevention: Discussed safety belts, safety helmets, smoke detector, smoking near bedding or upholstery.   -Sexuality: Discussed sexually transmitted diseases, partner selection, use of condoms, avoidance of unintended pregnancy and contraceptive alternatives.   -Dental health: Discussed importance of regular tooth brushing, flossing, and dental visits.  -Health maintenance and immunizations reviewed. Please refer to Health maintenance section.  Return to care in 1 year for next  preventative visit.     Subjective:  HPI:  He has no acute complaints today.   He has been having some increased frequency and nocturia for the past several months.  Is also still having issues with premature evaluation.  He is tried several medications for this in the past including Zoloft, Paxil and amitriptyline.  No use of been particularly effective.  Still occasionally has pain in his posterior right calf at the area where he had his DVT.  This usually comes and goes.  Occasionally has swelling to the area as well.  He is currently on Eliquis 5 mg twice daily and tolerating this well.  Lifestyle Diet: No specific diets or eating plans.  Exercise: Not specific exercise.   Depression screen PHQ 2/9 08/12/2019  Decreased Interest 0  Down, Depressed, Hopeless 0  PHQ - 2 Score 0    Health Maintenance Due  Topic Date Due  . COLONOSCOPY  08/06/2018     ROS: Per HPI, otherwise a complete review of systems was negative.   PMH:  The following were reviewed and entered/updated in epic: Past Medical History:  Diagnosis Date  . H/O shoulder surgery    Patient Active Problem List   Diagnosis Date Noted  . Dyslipidemia 08/12/2018  . Deep vein thrombosis (DVT) of distal vein of lower extremity ([place]) 07/29/2018  . Premature ejaculation 07/29/2018   Past Surgical History:  Procedure Laterality Date  . SHOULDER SURGERY Bilateral     Family History  Problem Relation Age of Onset  . Diabetes Father     Medications- reviewed and updated Current Outpatient Medications  Medication Sig Dispense Refill  . amitriptyline (ELAVIL) 50 MG tablet Take 1 tablet (50 mg total) by  mouth at bedtime. 90 tablet 3  . apixaban (ELIQUIS) 5 MG TABS tablet Take 1 tablet (5 mg total) by mouth 2 (two) times daily. 180 tablet 3   No current facility-administered medications for this visit.     Allergies-reviewed and updated No Known Allergies  Social History   Socioeconomic History  .  Marital status: Legally Separated    Spouse name: Not on file  . Number of children: Not on file  . Years of education: Not on file  . Highest education level: Not on file  Occupational History  . Not on file  Social Needs  . Financial resource strain: Not on file  . Food insecurity    Worry: Not on file    Inability: Not on file  . Transportation needs    Medical: Not on file    Non-medical: Not on file  Tobacco Use  . Smoking status: Never Smoker  . Smokeless tobacco: Never Used  Substance and Sexual Activity  . Alcohol use: Yes    Alcohol/week: 1.0 standard drinks    Types: 1 Glasses of wine per week  . Drug use: No  . Sexual activity: Not on file  Lifestyle  . Physical activity    Days per week: Not on file    Minutes per session: Not on file  . Stress: Not on file  Relationships  . Social Herbalist on phone: Not on file    Gets together: Not on file    Attends religious service: Not on file    Active member of club or organization: Not on file    Attends meetings of clubs or organizations: Not on file    Relationship status: Not on file  Other Topics Concern  . Not on file  Social History Narrative  . Not on file        Objective:  Physical Exam: BP 137/87   Pulse 62   Temp 97.7 F (36.5 C)   Ht _0  (1.753 m)   Wt 209 lb 3.2 oz (94.9 kg)   SpO2 99%   BMI 30.89 kg/m   Body mass index is 30.89 kg/m. Wt Readings from Last 3 Encounters:  08/12/19 209 lb 3.2 oz (94.9 kg)  12/31/18 200 lb (90.7 kg)  11/30/18 196 lb 9.6 oz (89.2 kg)   Gen: NAD, resting comfortably HEENT: TMs normal bilaterally. OP clear. No thyromegaly noted.  CV: RRR with no murmurs appreciated Pulm: NWOB, CTAB with no crackles, wheezes, or rhonchi GI: Normal bowel sounds present. Soft, Nontender, Nondistended. MSK: no edema, cyanosis, or clubbing noted Skin: warm, dry Neuro: CN2-12 grossly intact. Strength 5/5 in upper and lower extremities. Reflexes symmetric and  intact bilaterally.  Psych: Normal affect and thought content  Results for orders placed or performed in visit on 08/12/19 (from the past 24 hour(s))  POCT Urinalysis Dipstick (Automated)     Status: None   Collection Time: 08/12/19  1:51 PM  Result Value Ref Range   Color, UA Yellow    Clarity, UA Clear    Glucose, UA Negative Negative   Bilirubin, UA Negative    Ketones, UA Negative    Spec Grav, UA 1.015 1.010 - 1.025   Blood, UA Negative    pH, UA 6.0 5.0 - 8.0   Protein, UA Negative Negative   Urobilinogen, UA 0.2 0.2 or 1.0 E.U./dL   Nitrite, UA Negative    Leukocytes, UA Negative Negative  Algis Greenhouse. Jerline Pain, MD 08/12/2019 2:25 PM

## 2019-08-12 NOTE — Assessment & Plan Note (Signed)
Continue Eliquis 5 mg twice daily.  He is having some symptoms concerning for post thrombotic syndrome.  Will place referral to vascular surgery for further evaluation.

## 2019-08-12 NOTE — Assessment & Plan Note (Signed)
Has tried and failed several oral medications including several SSRIs and a TCA.  Will place referral to urology for further management.

## 2019-08-12 NOTE — Patient Instructions (Signed)
It was very nice to see you today!  I will place a referral for you to see the urologist, vascular surgeon, and for you to get a colonoscopy.  We will get blood work today.  Come back to see me in 1 year for your next physical, or sooner if needed.  Take care, Dr Jerline Pain  Please try these tips to maintain a healthy lifestyle:   Eat at least 3 REAL meals and 1-2 snacks per day.  Aim for no more than 5 hours between eating.  If you eat breakfast, please do so within one hour of getting up.    Obtain twice as many fruits/vegetables as protein or carbohydrate foods for both lunch and dinner. (Half of each meal should be fruits/vegetables, one quarter protein, and one quarter starchy carbs)   Cut down on sweet beverages. This includes juice, soda, and sweet tea.    Exercise at least 150 minutes every week.

## 2019-08-15 ENCOUNTER — Encounter: Payer: Self-pay | Admitting: Gastroenterology

## 2019-08-15 NOTE — Progress Notes (Signed)
Please inform patient of the following:  His "bad" cholesterol is a bit high but everything else is NORMAL. Do not need to start medications. He should continue working on diet and exercise and we can recheck in a year or so.  Allen Savage. Jerline Pain, MD 08/15/2019 8:03 AM

## 2019-08-25 ENCOUNTER — Telehealth: Payer: Self-pay

## 2019-08-25 ENCOUNTER — Encounter: Payer: Self-pay | Admitting: Gastroenterology

## 2019-08-25 ENCOUNTER — Ambulatory Visit: Payer: Commercial Managed Care - PPO | Admitting: Gastroenterology

## 2019-08-25 VITALS — BP 140/80 | HR 88 | Temp 98.4°F | Ht 69.0 in | Wt 207.0 lb

## 2019-08-25 DIAGNOSIS — Z7901 Long term (current) use of anticoagulants: Secondary | ICD-10-CM

## 2019-08-25 DIAGNOSIS — Z1211 Encounter for screening for malignant neoplasm of colon: Secondary | ICD-10-CM | POA: Insufficient documentation

## 2019-08-25 MED ORDER — NA SULFATE-K SULFATE-MG SULF 17.5-3.13-1.6 GM/177ML PO SOLN: 1.0000 | Freq: Once | ORAL | 0 refills | Status: AC

## 2019-08-25 NOTE — Patient Instructions (Signed)
You have been scheduled for a colonoscopy. Please follow written instructions given to you at your visit today.  Please pick up your prep supplies at the pharmacy within the next 1-3 days. If you use inhalers (even only as needed), please bring them with you on the day of your procedure.   You will be contacted by our office prior to your procedure for directions on holding your Eliquis.  If you do not hear from our office 1 week prior to your scheduled procedure, please call [phone] to discuss.    I appreciate the opportunity to care for you. Alonza Bogus, PA-C

## 2019-08-25 NOTE — Telephone Encounter (Signed)
  [facility] [address] [place], [place]  [address] Phone:  [phone]   Fax:  [phone]   08/25/2019   RE:      Allen Savage. DOB:   May 31, 1966 MRN:   [id number]   Dear Jerline Pain,    We have scheduled the above patient for an endoscopic procedure. Our records show that he is on anticoagulation therapy.   Please advise as to whether the patient may come off his therapy of Eliquis two days prior to the colonoscopy procedure, which is scheduled for 09/09/19.  Please fax back to [place], [place]  at [phone].   Sincerely,    Thurmon Fair, RMA

## 2019-08-25 NOTE — Progress Notes (Signed)
     08/25/2019 Allen Savage [id number] Dec 04, 1965   HISTORY OF PRESENT ILLNESS:  This is a pleasant 53 year old male who is new to our office.  He is here today at the request of his PCP to discuss and schedule a screening colonoscopy.  Never had one in the past.  No FH of colon cancer.  He is on Eliquis due to history of DVT and PE in 06/2018.  From what he knows and is aware, they plan on continuing lifelong anticoagulation.  Overall he says that he moves his bowels regularly but occasionally has some constipation.  When he does have some issues and is straining then he will see some bright red rectal bleeding on occasion.  No other complaints.  Hgb is normal.   Past Medical History:  Diagnosis Date  . History of pulmonary embolus (PE) 06/2018   from DVT in right calf   Past Surgical History:  Procedure Laterality Date  . SHOULDER SURGERY Bilateral     reports that he has never smoked. He has never used smokeless tobacco. He reports current alcohol use of about 1.0 standard drinks of alcohol per week. He reports that he does not use drugs. family history includes Diabetes in his father. No Known Allergies    Outpatient Encounter Medications as of 08/25/2019  Medication Sig  . amitriptyline (ELAVIL) 50 MG tablet Take 1 tablet (50 mg total) by mouth at bedtime.  Marland Kitchen apixaban (ELIQUIS) 5 MG TABS tablet Take 1 tablet (5 mg total) by mouth 2 (two) times daily.   No facility-administered encounter medications on file as of 08/25/2019.      REVIEW OF SYSTEMS  : All other systems reviewed and negative except where noted in the History of Present Illness.   PHYSICAL EXAM: BP 140/80   Pulse 88   Temp 98.4 F (36.9 C)   Ht 5\' 9"  (1.753 m)   Wt 207 lb (93.9 kg)   BMI 30.57 kg/m  General: Well developed black male in no acute distress Head: Normocephalic and atraumatic Eyes:  Sclerae anicteric, conjunctiva pink. Ears: Normal auditory acuity Lungs: Clear throughout to  auscultation; no increased WOB. Heart: Regular rate and rhythm; no M/R/G. Abdomen: Soft, non-distended.  BS present.  Non-tender. Rectal:  Will be done at the time of colonoscopy. Musculoskeletal: Symmetrical with no gross deformities  Skin: No lesions on visible extremities Extremities: No edema  Neurological: Alert oriented x 4, grossly non-focal Psychological:  Alert and cooperative. Normal mood and affect  ASSESSMENT AND PLAN: *Screening colonoscopy:  Never had one in the past.  Will schedule with Dr. Silverio Decamp. *Chronic anticoagulation with Eliquis due to history of DVT and PE in 06/2018:  Will hold Eliquis for 2 days prior to endoscopic procedures - will instruct when and how to resume after procedure. Benefits and risks of procedure explained including risks of bleeding, perforation, infection, missed lesions, reactions to medications and possible need for hospitalization and surgery for complications. Additional rare but real risk of stroke or other vascular clotting events off of Eliquis also explained and need to seek urgent help if any signs of these problems occur. Will communicate by phone or EMR with patient's prescribing provider, Dr. Jerline Pain, to confirm that holding Eliquis is reasonable in this case.  ? If he will need lovenox or heparin bridging, but will leave this decision to PCP.  CC:  Vivi Barrack, MD

## 2019-08-25 NOTE — Telephone Encounter (Signed)
Hi Allen Savage, it is fine for him to stop his Eliquis prior to his colonoscopy.  Thanks,  Algis Greenhouse. Jerline Pain, MD 08/25/2019 1:58 PM

## 2019-08-26 NOTE — Telephone Encounter (Signed)
Spoke with patient and informed him to hold his Eliquis 2 days prior to procedure. He verbalized understanding he will take his last dose on 09/06/2019 and resume after procedure as directed at discharge.

## 2019-09-01 ENCOUNTER — Other Ambulatory Visit: Payer: Self-pay | Admitting: Family Medicine

## 2019-09-02 NOTE — Progress Notes (Signed)
Reviewed and agree with documentation and assessment and plan. K. Veena Nandigam , MD   

## 2019-09-07 ENCOUNTER — Other Ambulatory Visit: Payer: Self-pay

## 2019-09-07 ENCOUNTER — Emergency Department (HOSPITAL_COMMUNITY): Payer: Commercial Managed Care - PPO

## 2019-09-07 ENCOUNTER — Ambulatory Visit (HOSPITAL_COMMUNITY)
Admission: EM | Admit: 2019-09-07 | Discharge: 2019-09-07 | Disposition: A | Discharge status: Home / Self Care | Payer: Commercial Managed Care - PPO | Source: Home / Self Care | Attending: Family Medicine | Admitting: Family Medicine

## 2019-09-07 ENCOUNTER — Emergency Department (HOSPITAL_COMMUNITY)
Admission: EM | Admit: 2019-09-07 | Discharge: 2019-09-07 | Discharge status: Against Medical Advice | Payer: Commercial Managed Care - PPO | Attending: Emergency Medicine | Admitting: Emergency Medicine

## 2019-09-07 ENCOUNTER — Encounter (HOSPITAL_COMMUNITY): Payer: Self-pay | Admitting: Emergency Medicine

## 2019-09-07 DIAGNOSIS — H539 Unspecified visual disturbance: Secondary | ICD-10-CM

## 2019-09-07 DIAGNOSIS — Z5321 Procedure and treatment not carried out due to patient leaving prior to being seen by health care provider: Secondary | ICD-10-CM | POA: Diagnosis not present

## 2019-09-07 DIAGNOSIS — R55 Syncope and collapse: Secondary | ICD-10-CM | POA: Diagnosis present

## 2019-09-07 LAB — CBC
HCT: 53.9 % — ABNORMAL HIGH (ref 39.0–52.0)
Hemoglobin: 18.3 g/dL — ABNORMAL HIGH (ref 13.0–17.0)
MCH: 32.2 pg (ref 26.0–34.0)
MCHC: 34 g/dL (ref 30.0–36.0)
MCV: 94.9 fL (ref 80.0–100.0)
Platelets: 176 10*3/uL (ref 150–400)
RBC: 5.68 MIL/uL (ref 4.22–5.81)
RDW: 13.6 % (ref 11.5–15.5)
WBC: 5.1 10*3/uL (ref 4.0–10.5)
nRBC: 0 % (ref 0.0–0.2)

## 2019-09-07 LAB — BASIC METABOLIC PANEL
Anion gap: 10 (ref 5–15)
BUN: 8 mg/dL (ref 6–20)
CO2: 25 mmol/L (ref 22–32)
Calcium: 9.2 mg/dL (ref 8.9–10.3)
Chloride: 105 mmol/L (ref 98–111)
Creatinine, Ser: 1.07 mg/dL (ref 0.61–1.24)
GFR calc Af Amer: >60 mL/min (ref >60)
GFR calc non Af Amer: >60 mL/min (ref >60)
Glucose, Bld: 98 mg/dL (ref 70–99)
Potassium: 4.3 mmol/L (ref 3.5–5.1)
Sodium: 140 mmol/L (ref 135–145)

## 2019-09-07 MED ORDER — SODIUM CHLORIDE 0.9% FLUSH: 3.0000 mL | Freq: Once | INTRAVENOUS | Status: DC

## 2019-09-07 NOTE — ED Triage Notes (Signed)
Dizziness for one week.  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  Notices vision changes with moving head from side to side.   No loc, but refers to lightheaded episodes.    Similar symptoms in the past, but no definite diagnosis. The only abnormality was  a sinus infection

## 2019-09-07 NOTE — ED Notes (Signed)
Pt states he has to leave to pick kids up.  Encouraged pt to stay and explained triage process and wait for room.  Pt declines and states he will return in morning because he needs a note to return to work.

## 2019-09-07 NOTE — ED Triage Notes (Signed)
Pt being sent from UC with c/o dizziness and near syncopal episodes for about a week. He denies ever passing out but states "I feel a pop in the back of his head and things started going black." Denies chest pain or SOB. Takes Eloquis for hx of PE

## 2019-09-07 NOTE — ED Provider Notes (Signed)
[facility]    CSN: [id number] Arrival date & time: 09/07/19  1248      History   Chief Complaint Chief Complaint  Patient presents with  . Dizziness    HPI Allen Benelton L Biancardi Jr. is a 53 y.o. male.   Allen BeneAlton L Delio Jr. presents with complaints of episodes in which his vision "can't catch up" with then "blacking out for a few nanoseconds." These episode occur when he turns his head. Does not happen every time he turns his head, however. Started approximately 1 week ago. Yesterday he had to leave work due to the frequency of this and he felt unsafe, as he drives a city bus. No headache. No other baseline vision changes. Once his vision returns after episodes he denies any floaters or curtain sensation to vision. No neck pain. No head injury. No numbness tingling weakness to extremities. He has felt fatigued. No chest pain , no shortness of breath . No cough. States years ago he had similar episode which was worked up without acute findings, ultimately saw an ENT who told him it was a sinus infection and to use nasal spray. States his symptoms gradually had improved. He has been using nasal spray and this has not helped. Denies any nasal drainage, cough, ear pain or sore throat. No facial pressure. He is on eliquis for history of dvt and PE. Appointment next week for repeat dvt study and vascular follow up.     ROS per HPI, negative if not otherwise mentioned.      Past Medical History:  Diagnosis Date  . History of pulmonary embolus (PE) 06/2018   from DVT in right calf    Patient Active Problem List   Diagnosis Date Noted  . Colon cancer screening 08/25/2019  . Chronic anticoagulation 08/25/2019  . Dyslipidemia 08/12/2018  . Deep vein thrombosis (DVT) of distal vein of lower extremity (HCC) 07/29/2018  . Premature ejaculation 07/29/2018    Past Surgical History:  Procedure Laterality Date  . SHOULDER SURGERY Bilateral        Home Medications    Prior to  Admission medications   Medication Sig Start Date End Date Taking? Authorizing Provider  amitriptyline (ELAVIL) 50 MG tablet TAKE 1 TABLET(50 MG) BY MOUTH AT BEDTIME 09/01/19  Yes Ardith DarkParker, Caleb M, MD  apixaban (ELIQUIS) 5 MG TABS tablet Take 5 mg by mouth 2 (two) times daily.   Yes [provider]  apixaban (ELIQUIS) 5 MG TABS tablet Take 1 tablet (5 mg total) by mouth 2 (two) times daily. 09/24/18 08/25/19  Arthur HolmsZhao, Yan, MD    Family History Family History  Problem Relation Age of Onset  . Diabetes Father   . Colon cancer Neg Hx   . Esophageal cancer Neg Hx   . Rectal cancer Neg Hx     Social History Social History   Tobacco Use  . Smoking status: Never Smoker  . Smokeless tobacco: Never Used  Substance Use Topics  . Alcohol use: Yes    Alcohol/week: 1.0 standard drinks    Types: 1 Glasses of wine per week  . Drug use: No     Allergies   Patient has no known allergies.   Review of Systems Review of Systems   Physical Exam Triage Vital Signs ED Triage Vitals  Enc Vitals Group     BP 09/07/19 1326 140/82     Pulse --      Resp 09/07/19 1326 18     Temp 09/07/19  1326 98.4 F (36.9 C)     Temp Source 09/07/19 1326 Oral     SpO2 09/07/19 1326 99 %     Weight --      Height --      Head Circumference --      Peak Flow --      Pain Score 09/07/19 1323 0     Pain Loc --      Pain Edu? --      Excl. in GC? --    No data found.  Updated Vital Signs BP 140/82 (BP Location: Right Arm)   Temp 98.4 F (36.9 C) (Oral)   Resp 18   SpO2 99%    Physical Exam Constitutional:      General: He is not in acute distress.    Appearance: He is well-developed. He is not toxic-appearing.  HENT:     Head: Normocephalic and atraumatic.     Ears:     Comments: Mild effusion noted to right tm, no redness or bulging     Nose: Nose normal.     Mouth/Throat:     Mouth: Mucous membranes are moist.  Eyes:     Pupils: Pupils are equal, round, and reactive to light.      Comments: Pupils 2-3 mm, round and reactive; patient symptomatic with EOM bilaterally; noted to have to bring eyes back to midline and repeatedly blink to regain vision after brief loss with movement to periphery bilaterally   Neck:     Musculoskeletal: Normal range of motion.     Vascular: No carotid bruit.  Cardiovascular:     Rate and Rhythm: Normal rate and regular rhythm.  Pulmonary:     Effort: Pulmonary effort is normal.     Breath sounds: Normal breath sounds.  Skin:    General: Skin is warm and dry.  Neurological:     General: No focal deficit present.     Mental Status: He is alert and oriented to person, place, and time.     GCS: GCS eye subscore is 4. GCS verbal subscore is 5. GCS motor subscore is 6.     Cranial Nerves: No cranial nerve deficit or facial asymmetry.     Sensory: Sensation is intact.     Motor: Motor function is intact.     Coordination: Coordination is intact.     Gait: Gait is intact.     Comments: See notes on EOM       UC Treatments / Results  Labs (all labs ordered are listed, but only abnormal results are displayed) Labs Reviewed - No data to display  EKG   Radiology No results found.  Procedures Procedures (including critical care time)  Medications Ordered in UC Medications - No data to display  Initial Impression / Assessment and Plan / UC Course  I have reviewed the triage vital signs and the nursing notes.  Pertinent labs & imaging results that were available during my care of the patient were reviewed by me and considered in my medical decision making (see chart for details).     Patient with brief loss of vision with movement of eyes to periphery, reproducible here in clinic. Otherwise vision appears WNL with normal acuity here today. No other neurological findings on exam.  No headache or head injury. He does have history of clots and on a blood thinner. Concern for some sort of lesion potentially causing these vision losses?  Recommend further evaluation in the er, may need  ophthalmological follow up as well. Patient safe for self transport to ER.   Final Clinical Impressions(s) / UC Diagnoses   Final diagnoses:  Vision changes     Discharge Instructions     I do feel that your symptoms warrant further evaluation, I would recommend going to the ER at this time.    ED Prescriptions    None     PDMP not reviewed this encounter.   Zigmund Gottron, NP 09/07/19 1420

## 2019-09-07 NOTE — Discharge Instructions (Addendum)
I do feel that your symptoms warrant further evaluation, I would recommend going to the ER at this time.

## 2019-09-08 ENCOUNTER — Emergency Department (HOSPITAL_COMMUNITY)
Admission: EM | Admit: 2019-09-08 | Discharge: 2019-09-08 | Disposition: A | Discharge status: Home / Self Care | Payer: Commercial Managed Care - PPO | Attending: Emergency Medicine | Admitting: Emergency Medicine

## 2019-09-08 ENCOUNTER — Telehealth: Payer: Self-pay

## 2019-09-08 ENCOUNTER — Other Ambulatory Visit: Payer: Self-pay

## 2019-09-08 DIAGNOSIS — R42 Dizziness and giddiness: Secondary | ICD-10-CM | POA: Diagnosis not present

## 2019-09-08 DIAGNOSIS — Z5320 Procedure and treatment not carried out because of patient's decision for unspecified reasons: Secondary | ICD-10-CM | POA: Diagnosis not present

## 2019-09-08 DIAGNOSIS — Z86711 Personal history of pulmonary embolism: Secondary | ICD-10-CM | POA: Diagnosis not present

## 2019-09-08 DIAGNOSIS — H538 Other visual disturbances: Secondary | ICD-10-CM | POA: Diagnosis not present

## 2019-09-08 DIAGNOSIS — Z7901 Long term (current) use of anticoagulants: Secondary | ICD-10-CM | POA: Diagnosis not present

## 2019-09-08 DIAGNOSIS — Z86718 Personal history of other venous thrombosis and embolism: Secondary | ICD-10-CM | POA: Diagnosis not present

## 2019-09-08 NOTE — ED Provider Notes (Signed)
[facility]PART[facility] Provider Note   CSN: [id number] Arrival date & time: 09/08/19  1200     History   Chief Complaint Chief Complaint  Patient presents with   Follow-up    HPI Allen Savage. is a 53 y.o. male with past medical history significant for dvt and PE on Eliquis presents to emergency department today with chief complaint of visual changes.  Patient states is been going on for last week.  He describes the visual changes as feeling like his vision cannot catch up and then he will have brief blacking out.  He states his happens when he turns his head, does not happen every time it has been becoming more frequent.  He reports associated dizziness with symptoms. 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 He denies seeing floaters or feeling like curtain falling over vision, head injury, numbness, weakness, tingling. Does not wear glasses or contacts.  Patient does report that years ago he had similar symptoms had a negative work-up saw ENT and was told he had a sinus infection.  After using nasal spray symptoms gradually improved.  This time he has been using nasal spray without symptom improvement. Denies URI symptoms.  Pt came to the ED yesterday for the same. He had lab work and head CT ordered in triage. He left before seeing a provider for results.  Past Medical History:  Diagnosis Date   History of pulmonary embolus (PE) 06/2018   from DVT in right calf    Patient Active Problem List   Diagnosis Date Noted   Colon cancer screening 08/25/2019   Chronic anticoagulation 08/25/2019   Dyslipidemia 08/12/2018   Deep vein thrombosis (DVT) of distal vein of lower extremity ([place]) 07/29/2018   Premature ejaculation 07/29/2018    Past Surgical History:  Procedure Laterality Date   SHOULDER SURGERY Bilateral         Home Medications    Prior to Admission medications   Medication Sig Start  Date End Date Taking? Authorizing Provider  amitriptyline (ELAVIL) 50 MG tablet TAKE 1 TABLET(50 MG) BY MOUTH AT BEDTIME 09/01/19   Vivi Barrack, MD  apixaban (ELIQUIS) 5 MG TABS tablet Take 1 tablet (5 mg total) by mouth 2 (two) times daily. 09/24/18 08/25/19  Tish Men, MD  apixaban (ELIQUIS) 5 MG TABS tablet Take 5 mg by mouth 2 (two) times daily.    [provider]    Family History Family History  Problem Relation Age of Onset   Diabetes Father    Colon cancer Neg Hx    Esophageal cancer Neg Hx    Rectal cancer Neg Hx     Social History Social History   Tobacco Use   Smoking status: Never Smoker   Smokeless tobacco: Never Used  Substance Use Topics   Alcohol use: Yes    Alcohol/week: 1.0 standard drinks    Types: 1 Glasses of wine per week   Drug use: No     Allergies   Patient has no known allergies.   Review of Systems Review of Systems  Constitutional: Negative for chills and fever.  HENT: Negative for congestion, rhinorrhea, sinus pressure and sore throat.   Eyes: Positive for visual disturbance. Negative for pain and redness.  Respiratory: Negative for cough, shortness of breath and wheezing.   Cardiovascular: Negative for chest pain and palpitations.  Gastrointestinal: Negative for abdominal pain, constipation, diarrhea, nausea and vomiting.  Genitourinary: Negative for dysuria.  Musculoskeletal: Negative for arthralgias, back pain, myalgias and neck pain.  Skin: Negative for rash and wound.  Neurological: Positive for dizziness. Negative for syncope, weakness, numbness and headaches.  Psychiatric/Behavioral: Negative for confusion.     Physical Exam Updated Vital Signs BP 132/90    Pulse 91    Temp 98.3 F (36.8 C) (Oral)    Resp 16    SpO2 100%   Physical Exam Vitals signs and nursing note reviewed.  Constitutional:      General: He is not in acute distress.    Appearance: He is not ill-appearing.  HENT:     Head: Normocephalic  and atraumatic.     Comments: No tenderness to palpation of skull. No deformities or crepitus noted. No open wounds, abrasions or lacerations.    Right Ear: Tympanic membrane and external ear normal.     Left Ear: Tympanic membrane and external ear normal.     Nose: Nose normal.     Mouth/Throat:     Mouth: Mucous membranes are moist.     Pharynx: Oropharynx is clear.  Eyes:     General: No scleral icterus.       Right eye: No discharge.        Left eye: No discharge.     Extraocular Movements: Extraocular movements intact.     Conjunctiva/sclera: Conjunctivae normal.     Pupils: Pupils are equal, round, and reactive to light.  Neck:     Musculoskeletal: Normal range of motion. No neck rigidity or muscular tenderness.     Vascular: No JVD.  Cardiovascular:     Rate and Rhythm: Normal rate and regular rhythm.     Pulses: Normal pulses.          Radial pulses are 2+ on the right side and 2+ on the left side.     Heart sounds: Normal heart sounds.  Pulmonary:     Comments: Lungs clear to auscultation in all fields. Symmetric chest rise. No wheezing, rales, or rhonchi. Abdominal:     Comments: Abdomen is soft, non-distended, and non-tender in all quadrants. No rigidity, no guarding. No peritoneal signs.  Musculoskeletal: Normal range of motion.  Skin:    General: Skin is warm and dry.     Capillary Refill: Capillary refill takes less than 2 seconds.  Neurological:     Mental Status: He is oriented to person, place, and time.     GCS: GCS eye subscore is 4. GCS verbal subscore is 5. GCS motor subscore is 6.     Comments: Mental Status:  Alert, oriented, thought content appropriate, able to give a coherent history. Speech fluent without evidence of aphasia. Able to follow 2 step commands without difficulty.  Cranial Nerves:  II:  Peripheral visual fields grossly normal, pupils equal, round, reactive to light  Pupils 2-3 mm, round and reactive; patient symptomatic with EOM on left eye  only; peripheral vision intact.  III,IV, VI: ptosis not present, extra-ocular motions intact bilaterally  V,VII: smile symmetric, facial light touch sensation equal VIII: hearing grossly normal to voice  X: uvula elevates symmetrically  XI: bilateral shoulder shrug symmetric and strong XII: midline tongue extension without fassiculations Motor:  Normal tone. 5/5 in upper and lower extremities bilaterally including strong and equal grip strength and dorsiflexion/plantar flexion Sensory: Pinprick and light touch normal in all extremities.  Deep Tendon Reflexes: 2+ and symmetric in the biceps and patella Cerebellar: normal finger-to-nose with bilateral upper extremities Gait:  normal gait and balance CV: distal pulses palpable throughout     Psychiatric:        Behavior: Behavior normal.      ED Treatments / Results  Labs (all labs ordered are listed, but only abnormal results are displayed) Labs Reviewed - No data to display  EKG None  Radiology Ct Head Wo Contrast  Result Date: 09/07/2019 CLINICAL DATA:  Syncope. EXAM: CT HEAD WITHOUT CONTRAST TECHNIQUE: Contiguous axial images were obtained from the base of the skull through the vertex without intravenous contrast. COMPARISON:  None. FINDINGS: Brain: No evidence of acute infarction, hemorrhage, hydrocephalus, extra-axial collection or mass lesion/mass effect. Vascular: No hyperdense vessel or unexpected calcification. Skull: Normal. Negative for fracture or focal lesion. Sinuses/Orbits: Globes and orbits are unremarkable. There are small mucous retention cysts in the inferior maxillary sinuses. Visualized sinuses otherwise clear. Other: None. IMPRESSION: No intracranial abnormality. Electronically Signed   By: Amie Portland M.D.   On: 09/07/2019 16:08    Procedures Procedures (including critical care time)  Medications Ordered in ED Medications - No data to display   Initial Impression / Assessment and Plan / ED Course  I  have reviewed the triage vital signs and the nursing notes.  Pertinent labs & imaging results that were available during my care of the patient were reviewed by me and considered in my medical decision making (see chart for details).   Patient seen and examined. He has a history of dvt and PE on eliquis that he reports compliance with. Patient nontoxic appearing, in no apparent distress, vitals WNL. I viewed labs and head CT from yesterday, all were unremarkable. On exam he states blurry vision in left eye only. Both eyes are tracking on exam. Slight horizontal nystagmus that is fatigable. Neuro exam is otherwise normal. No areas of numbness, tingling, weakness to suggest MS. Patient also evaluated by ED attending Dr. Rubin Payor who agrees with plan to MRA and MR brain for more advanced imaging. With his history of DVT and PE there is concern for possible clot or lesion in brain causing blurry vision. Pt is requesting to be discharged before MR/MRA.. I discussed at length the concern for possible clots in brain causing visual changes.I have discussed my concerns as pt's  provider and the possibility that this may worsen. I have specifically discussed that without further evaluation I cannot guarantee there is not a life threatening event occuring.  Pt is A&Ox4, his own POA and states understanding of my concerns and the possible consequences.  I have made pt aware that this is an AMA discharge, but they may return at any time for further evaluation and treatment. He was given follow up information for neurology and opthalmology.   Portions of this note were generated with Scientist, clinical (histocompatibility and immunogenetics). Dictation errors may occur despite best attempts at proofreading.    Final Clinical Impressions(s) / ED Diagnoses   Final diagnoses:  Blurry vision    ED Discharge Orders    None       Kathyrn Lass 09/08/19 1532    Benjiman Core, MD 09/08/19 [phone]

## 2019-09-08 NOTE — ED Notes (Signed)
Patient verbalizes understanding of discharge instructions. Opportunity for questioning and answers were provided. Armband removed by staff, pt discharged from ED. Ambulated out to lobby  

## 2019-09-08 NOTE — Discharge Instructions (Signed)
You have been seen today for dizziness and blurry vision. Please read and follow all provided instructions. Return to the emergency room for worsening condition or new concerning symptoms.    1. Medications:  Continue usual home medications Take medications as prescribed. Please review all of the medicines and only take them if you do not have an allergy to them.  2. Treatment: rest, drink plenty of fluids 3. Follow Up: Please follow up with your primary doctor in 2-5 days for discussion. -I have also included information for an eye doctor you can call to schedule a follow up appointment with

## 2019-09-08 NOTE — Telephone Encounter (Signed)
Covid-19 screening questions   Do you now or have you had a fever in the last 14 days? NO   Do you have any respiratory symptoms of shortness of breath or cough now or in the last 14 days? NO  Do you have any family members or close contacts with diagnosed or suspected Covid-19 in the past 14 days? NO  Have you been tested for Covid-19 and found to be positive? NO        

## 2019-09-08 NOTE — ED Triage Notes (Signed)
Pt seen here yesterday, had a full work up but had to leave for family purposes. He returns today to get the results of test completed yesterday and a dr's note for work

## 2019-09-09 ENCOUNTER — Encounter: Payer: Self-pay | Admitting: Gastroenterology

## 2019-09-09 ENCOUNTER — Ambulatory Visit (AMBULATORY_SURGERY_CENTER): Payer: Commercial Managed Care - PPO | Admitting: Gastroenterology

## 2019-09-09 VITALS — BP 106/71 | HR 81 | Temp 97.8°F | Resp 26 | Ht 69.0 in | Wt 207.0 lb

## 2019-09-09 DIAGNOSIS — Z1211 Encounter for screening for malignant neoplasm of colon: Secondary | ICD-10-CM | POA: Diagnosis present

## 2019-09-09 NOTE — Progress Notes (Signed)
Temperature taken by K.A., VS taken by C.W. 

## 2019-09-09 NOTE — Patient Instructions (Signed)
HANDOUT PROVIDED ON:  DIVERTICULOSIS & HEMORRHOIDS  YOU MAY RESUME YOUR PREVIOUS DIET.  YOU MAY RESUME YOUR ELIQUIS TOMORROW 09/10/2019.  THANK YOU FOR ALLOWING [place] TO SERVE YOU TODAY!  YOU HAD AN ENDOSCOPIC PROCEDURE TODAY AT [facility]:   Refer to the procedure report that was given to you for any specific questions about what was found during the examination.  If the procedure report does not answer your questions, please call your gastroenterologist to clarify.  If you requested that your care partner not be given the details of your procedure findings, then the procedure report has been included in a sealed envelope for you to review at your convenience later.  YOU SHOULD EXPECT: Some feelings of bloating in the abdomen. Passage of more gas than usual.  Walking can help get rid of the air that was put into your GI tract during the procedure and reduce the bloating. If you had a lower endoscopy (such as a colonoscopy or flexible sigmoidoscopy) you may notice spotting of blood in your stool or on the toilet paper. If you underwent a bowel prep for your procedure, you may not have a normal bowel movement for a few days.  Please Note:  You might notice some irritation and congestion in your nose or some drainage.  This is from the oxygen used during your procedure.  There is no need for concern and it should clear up in a day or so.  SYMPTOMS TO REPORT IMMEDIATELY:   Following lower endoscopy (colonoscopy or flexible sigmoidoscopy):  Excessive amounts of blood in the stool  Significant tenderness or worsening of abdominal pains  Swelling of the abdomen that is new, acute  Fever of 100F or higher   For urgent or emergent issues, a gastroenterologist can be reached at any hour by calling [phone].   DIET:  We do recommend a small meal at first, but then you may proceed to your regular diet.  Drink plenty of fluids but you should avoid alcoholic beverages for 24  hours.  ACTIVITY:  You should plan to take it easy for the rest of today and you should NOT DRIVE or use heavy machinery until tomorrow (because of the sedation medicines used during the test).    FOLLOW UP: Our staff will call the number listed on your records 48-72 hours following your procedure to check on you and address any questions or concerns that you may have regarding the information given to you following your procedure. If we do not reach you, we will leave a message.  We will attempt to reach you two times.  During this call, we will ask if you have developed any symptoms of COVID 19. If you develop any symptoms (ie: fever, flu-like symptoms, shortness of breath, cough etc.) before then, please call [phone].  If you test positive for Covid 19 in the 2 weeks post procedure, please call and report this information to [place].    If any biopsies were taken you will be contacted by phone or by letter within the next 1-3 weeks.  Please call us at [phone] if you have not heard about the biopsies in 3 weeks.    SIGNATURES/CONFIDENTIALITY: You and/or your care partner have signed paperwork which will be entered into your electronic medical record.  These signatures attest to the fact that that the information above on your After Visit Summary has been reviewed and is understood.  Full responsibility of the confidentiality of this discharge information  lies with you and/or your care-partner.

## 2019-09-09 NOTE — Op Note (Addendum)
[facility] Patient Name: Allen Savage Arif Procedure Date: 09/09/2019 1:32 PM MRN: [id number] Endoscopist: Napoleon FormKavitha V. Arianny Pun , MD Age: 53 Referring MD:  Date of Birth: 1966/07/11 Gender: Male Account #: [id number] Procedure:                Colonoscopy Indications:              Screening for colorectal malignant neoplasm Medicines:                Monitored Anesthesia Care Procedure:                Pre-Anesthesia Assessment:                           - Prior to the procedure, a History and Physical                            was performed, and patient medications and                            allergies were reviewed. The patient's tolerance of                            previous anesthesia was also reviewed. The risks                            and benefits of the procedure and the sedation                            options and risks were discussed with the patient.                            All questions were answered, and informed consent                            was obtained. Prior Anticoagulants: The patient                            last took Eliquis (apixaban) 2 days prior to the                            procedure. ASA Grade Assessment: II - A patient                            with mild systemic disease. After reviewing the                            risks and benefits, the patient was deemed in                            satisfactory condition to undergo the procedure.                           After obtaining informed consent, the colonoscope  was passed under direct vision. Throughout the                            procedure, the patient's blood pressure, pulse, and                            oxygen saturations were monitored continuously. The                            Colonoscope was introduced through the anus and                            advanced to the the cecum, identified by                            appendiceal orifice and  ileocecal valve. The                            colonoscopy was performed without difficulty. The                            patient tolerated the procedure well. The quality                            of the bowel preparation was excellent. The                            ileocecal valve, appendiceal orifice, and rectum                            were photographed. Scope In: 1:35:06 PM Scope Out: 1:48:11 PM Scope Withdrawal Time: 0 hours 8 minutes 58 seconds  Total Procedure Duration: 0 hours 13 minutes 5 seconds  Findings:                 The perianal and digital rectal examinations were                            normal.                           Multiple small and large-mouthed diverticula were                            found in the sigmoid colon and descending colon.                           Non-bleeding internal hemorrhoids were found during                            retroflexion. The hemorrhoids were small. Complications:            No immediate complications. Estimated Blood Loss:     Estimated blood loss was minimal. Impression:               - Diverticulosis in the sigmoid colon and in  the                            descending colon.                           - Non-bleeding internal hemorrhoids.                           - No specimens collected. Recommendation:           - Patient has a contact number available for                            emergencies. The signs and symptoms of potential                            delayed complications were discussed with the                            patient. Return to normal activities tomorrow.                            Written discharge instructions were provided to the                            patient.                           - Resume previous diet.                           - Continue present medications.                           - Repeat colonoscopy in 10 years for screening                            purposes.                            - Resume Eliquis (apixaban) at prior dose today.                            Refer to managing physician for further adjustment                            of therapy. Napoleon Form, MD 09/09/2019 2:01:28 PM This report has been signed electronically.

## 2019-09-09 NOTE — Progress Notes (Signed)
A/ox3, pleased with MAC, report to RN 

## 2019-09-12 ENCOUNTER — Telehealth (HOSPITAL_COMMUNITY): Payer: Self-pay | Admitting: *Deleted

## 2019-09-12 ENCOUNTER — Other Ambulatory Visit: Payer: Self-pay

## 2019-09-12 DIAGNOSIS — I82531 Chronic embolism and thrombosis of right popliteal vein: Secondary | ICD-10-CM

## 2019-09-12 NOTE — Addendum Note (Signed)
Addended by: York Cerise C on: 09/12/2019 09:28 AM   Modules accepted: Orders

## 2019-09-12 NOTE — Telephone Encounter (Signed)

## 2019-09-13 ENCOUNTER — Other Ambulatory Visit: Payer: Self-pay

## 2019-09-13 ENCOUNTER — Ambulatory Visit (HOSPITAL_COMMUNITY)
Admission: RE | Admit: 2019-09-13 | Discharge: 2019-09-13 | Disposition: A | Discharge status: Home / Self Care | Payer: Commercial Managed Care - PPO | Source: Ambulatory Visit | Attending: Family | Admitting: Family

## 2019-09-13 ENCOUNTER — Telehealth: Payer: Self-pay

## 2019-09-13 ENCOUNTER — Ambulatory Visit (INDEPENDENT_AMBULATORY_CARE_PROVIDER_SITE_OTHER): Payer: Commercial Managed Care - PPO | Admitting: Vascular Surgery

## 2019-09-13 ENCOUNTER — Encounter: Payer: Self-pay | Admitting: Vascular Surgery

## 2019-09-13 VITALS — BP 115/81 | HR 89 | Temp 97.9°F | Resp 20 | Ht 69.0 in | Wt 211.8 lb

## 2019-09-13 DIAGNOSIS — Z86711 Personal history of pulmonary embolism: Secondary | ICD-10-CM | POA: Diagnosis not present

## 2019-09-13 DIAGNOSIS — I82531 Chronic embolism and thrombosis of right popliteal vein: Secondary | ICD-10-CM

## 2019-09-13 NOTE — Telephone Encounter (Signed)
Unable to leave message no name on answering machine.

## 2019-09-13 NOTE — Progress Notes (Signed)
[facility] [place]  Patient name: Allen Savage. MRN: [id number] DOB: December 18, 1965 Sex: male  REASON FOR CONSULT: Discuss chronic DVT right popliteal vein  HPI: Allen Savage. is a 53 y.o. male, who is here today for discussion of chronic DVT.  He had a episode of shortness of breath and right leg swelling in August 2019.  He was admitted to [facility] with a diagnosis of submassive PE and right leg DVT.  He has been on anticoagulation since that time.  He had no prior events of thrombus and has had none since.  He has had several ultrasounds of his right leg since this and has had chronic nonocclusive thrombus in his popliteal vein.  He is seen today for further discussion.  He reports that he has had complete resolution of leg swelling.  He did have a partial hypercoagulable work-up when admitted in August 2019 and this was normal.  He has no family history of abnormal thrombotic events  Past Medical History:  Diagnosis Date  . DVT (deep venous thrombosis) ([place])   . History of pulmonary embolus (PE) 06/2018   from DVT in right calf    Family History  Problem Relation Age of Onset  . Diabetes Father   . Colon cancer Neg Hx   . Esophageal cancer Neg Hx   . Rectal cancer Neg Hx   . Stomach cancer Neg Hx     SOCIAL HISTORY: Social History   Socioeconomic History  . Marital status: Significant Other    Spouse name: Not on file  . Number of children: 2  . Years of education: Not on file  . Highest education level: Not on file  Occupational History  . Occupation: [employer] Needs  . Financial resource strain: Not on file  . Food insecurity    Worry: Not on file    Inability: Not on file  . Transportation needs    Medical: Not on file    Non-medical: Not on file  Tobacco Use  . Smoking status: Never Smoker  . Smokeless tobacco: Never Used  Substance and Sexual Activity  . Alcohol use: Yes   Alcohol/week: 1.0 standard drinks    Types: 1 Glasses of wine per week  . Drug use: No  . Sexual activity: Not on file  Lifestyle  . Physical activity    Days per week: Not on file    Minutes per session: Not on file  . Stress: Not on file  Relationships  . Social Herbalist on phone: Not on file    Gets together: Not on file    Attends religious service: Not on file    Active member of club or organization: Not on file    Attends meetings of clubs or organizations: Not on file    Relationship status: Not on file  . Intimate partner violence    Fear of current or ex partner: Not on file    Emotionally abused: Not on file    Physically abused: Not on file    Forced sexual activity: Not on file  Other Topics Concern  . Not on file  Social History Narrative  . Not on file    No Known Allergies  Current Outpatient Medications  Medication Sig Dispense Refill  . amitriptyline (ELAVIL) 50 MG tablet TAKE 1 TABLET(50 MG) BY MOUTH AT BEDTIME 90 tablet 3  . apixaban (ELIQUIS) 5 MG TABS  tablet Take 5 mg by mouth 2 (two) times daily.     No current facility-administered medications for this visit.     REVIEW OF SYSTEMS:  [X]  denotes positive finding, [ ]  denotes negative finding Cardiac  Comments:  Chest pain or chest pressure:    Shortness of breath upon exertion:    Short of breath when lying flat:    Irregular heart rhythm:        Vascular    Pain in calf, thigh, or hip brought on by ambulation:    Pain in feet at night that wakes you up from your sleep:     Blood clot in your veins: x   Leg swelling:         Pulmonary    Oxygen at home:    Productive cough:     Wheezing:         Neurologic    Sudden weakness in arms or legs:     Sudden numbness in arms or legs:     Sudden onset of difficulty speaking or slurred speech:    Temporary loss of vision in one eye:     Problems with dizziness:         Gastrointestinal    Blood in stool:     Vomited blood:          Genitourinary    Burning when urinating:     Blood in urine:        Psychiatric    Major depression:         Hematologic    Bleeding problems:    Problems with blood clotting too easily: x       Skin    Rashes or ulcers:        Constitutional    Fever or chills:      PHYSICAL EXAM: Vitals:   09/13/19 1546  BP: 115/81  Pulse: 89  Resp: 20  Temp: 97.9 F (36.6 C)  SpO2: 100%  Weight: 96.1 kg  Height: 5\' 9"  (1.753 m)    GENERAL: The patient is a well-nourished male, in no acute distress. The vital signs are documented above. CARDIOVASCULAR: 2+ radial 2+ femoral and 2+ dorsalis pedis pulses bilaterally.  No swelling and no changes of chronic venous hypertension PULMONARY: There is good air exchange  ABDOMEN: Soft and non-tender  MUSCULOSKELETAL: There are no major deformities or cyanosis. NEUROLOGIC: No focal weakness or paresthesias are detected. SKIN: There are no ulcers or rashes noted. PSYCHIATRIC: The patient has a normal affect.  DATA:  Duplex today showed nonocclusive chronic thrombus in the popliteal vein on the right  MEDICAL ISSUES: Had long discussion with the patient.  I explained that now that he is 14 months out that in all likelihood he will continue to have chronic partially occlusive thrombus in his popliteal vein.  Fortunately he has had no swelling or evidence of postphlebitic syndrome.  I do feel that it is safe for him to discontinue his anticoagulation.  I feel that the risk of chronic ongoing anticoagulation out weigh any risk for recurrent thrombus.  I did explain that if he did have recurrent thrombotic event would in all likelihood require lifelong anticoagulation.  I would recommend that he begin 81 mg aspirin per day.  He was reassured with this discussion will see again on an as-needed basis   09/15/19, MD [facility] Tel [phone] Pager [phone]

## 2019-09-28 IMAGING — CT CT ANGIO CHEST
3 of 7 series · 18 of 36 positions shown · IV contrast (iopamidol)
Comparison: Chest radiograph dated 07/20/2018

CLINICAL DATA: 51-year-old male with chest pain. Concern for
pulmonary embolism.

EXAM:
CT ANGIOGRAPHY CHEST WITH CONTRAST
TECHNIQUE: Multidetector CT imaging of the chest was performed using the
standard protocol during bolus administration of intravenous
contrast. Multiplanar CT image reconstructions and MIPs were
obtained to evaluate the vascular anatomy.
CONTRAST:  <See Chart> [id number] IOPAMIDOL ([id number]) INJECTION
76%

[Series 6: pe lung · axial · 0.81mm/px · z∈[+1411,+1467]mm · 2 of 114 slices shown]
[im 29/114  mediastinal]
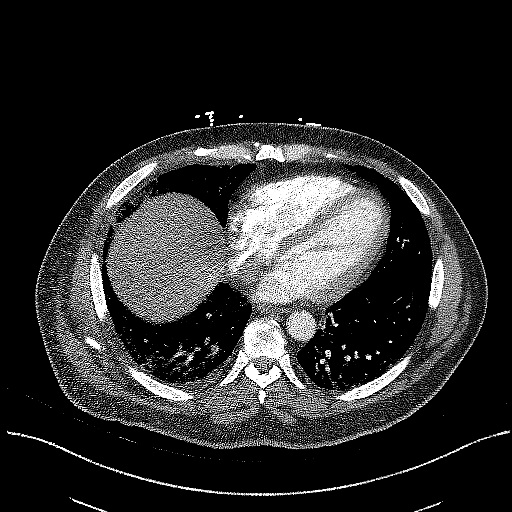
[im 57/114  mediastinal]
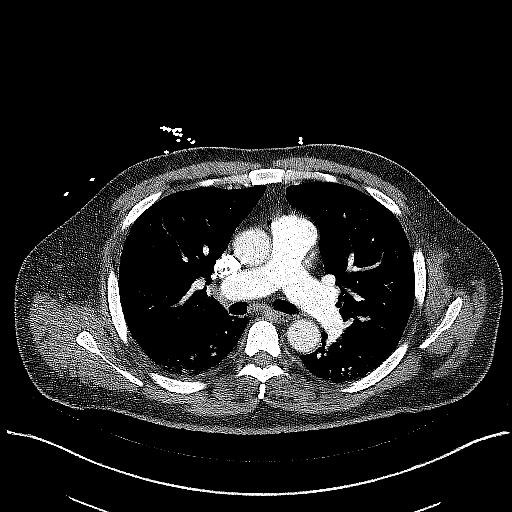

[Series 7: pe thins · axial · 0.81mm/px · z∈[+1351,+1565]mm · 15 of 350 slices shown]
[im 22/350  lung]
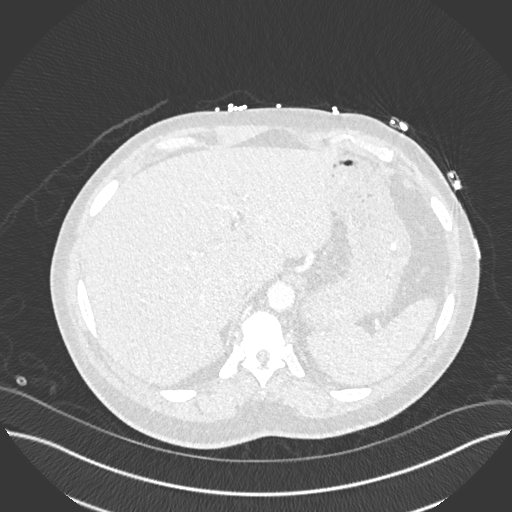
[im 44/350  mediastinal]
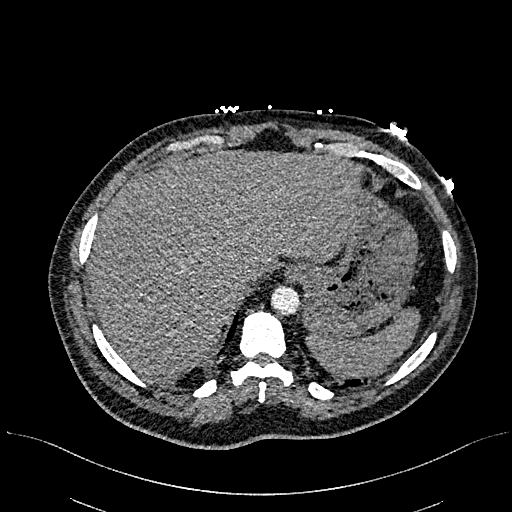
[im 66/350  lung]
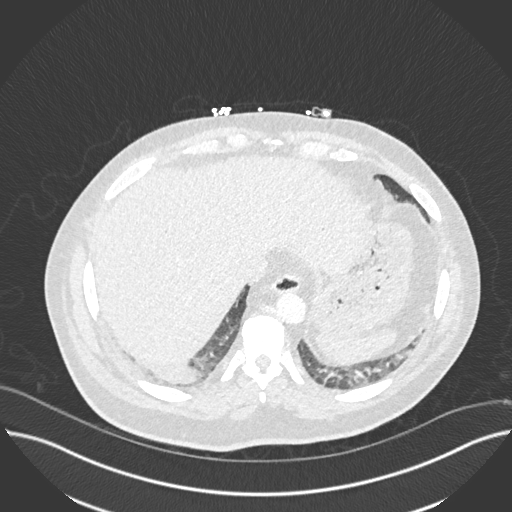
[im 88/350  mediastinal]
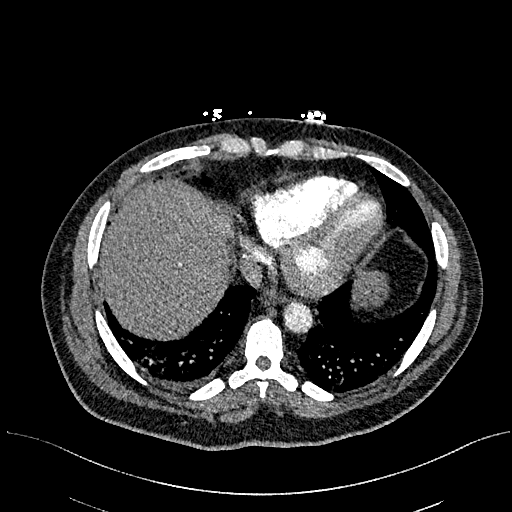
[im 110/350  lung]
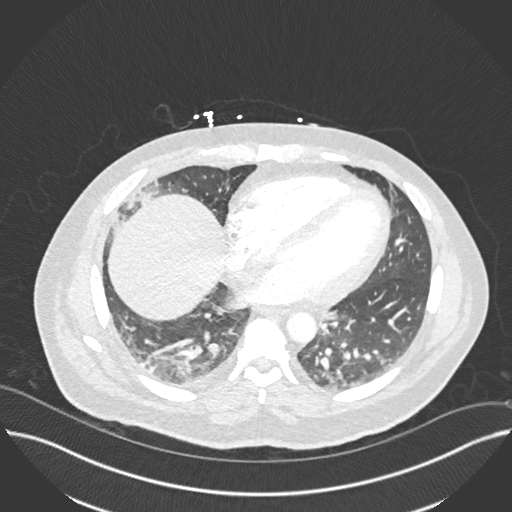
[im 131/350  mediastinal]
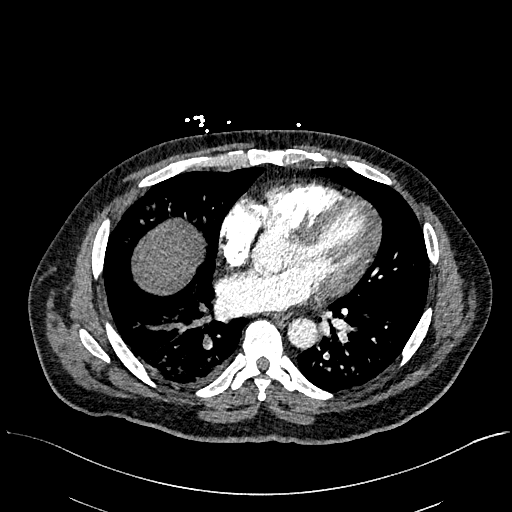
[im 153/350  lung]
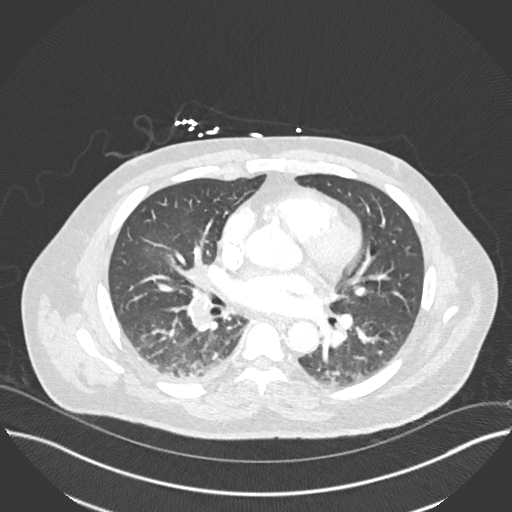
[im 175/350  mediastinal]
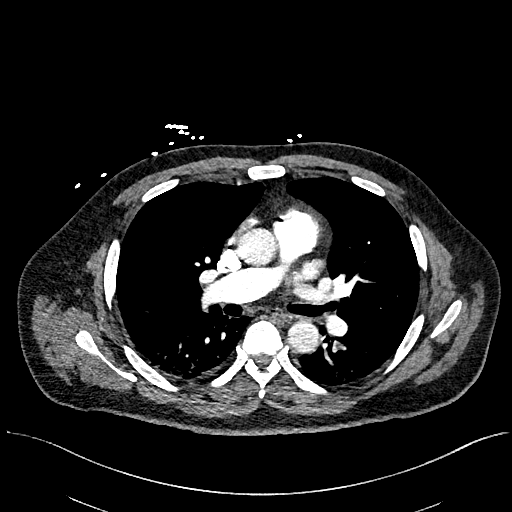
[im 197/350  lung]
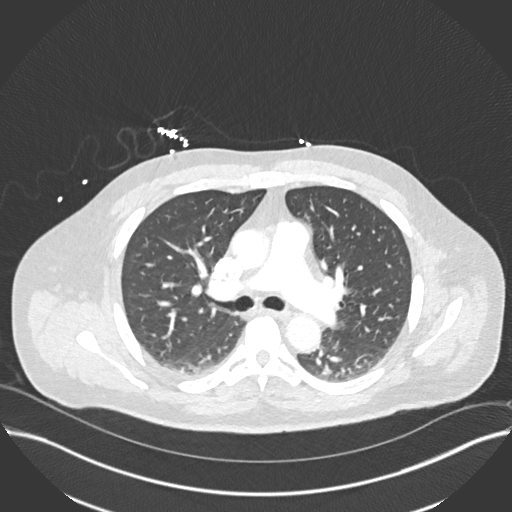
[im 219/350  mediastinal]
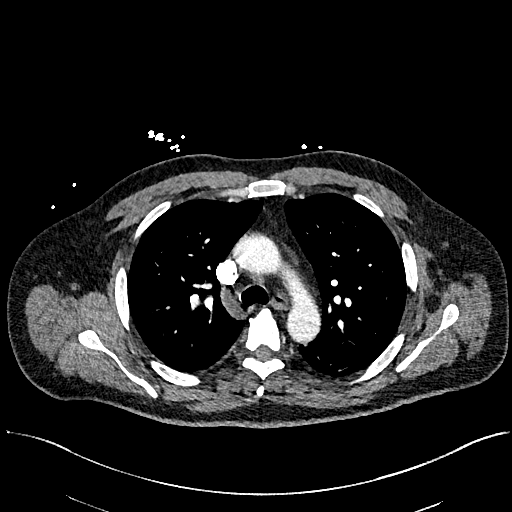
[im 240/350  lung]
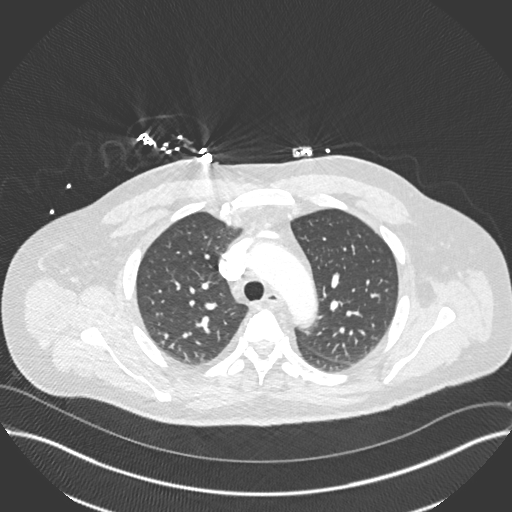
[im 262/350  mediastinal]
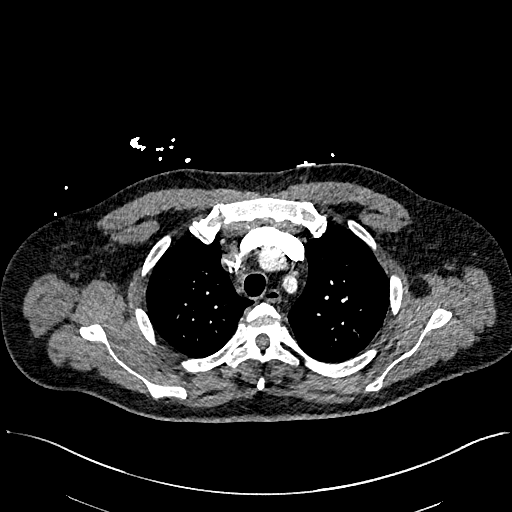
[im 284/350  lung]
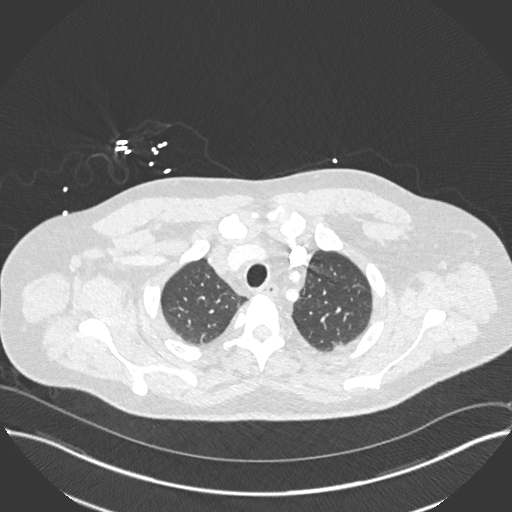
[im 306/350  mediastinal]
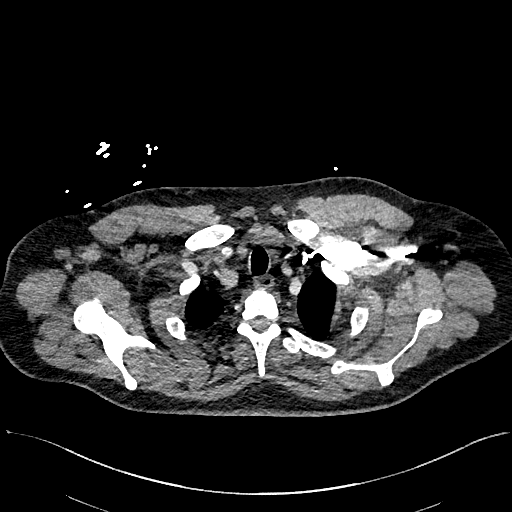
[im 328/350  lung]
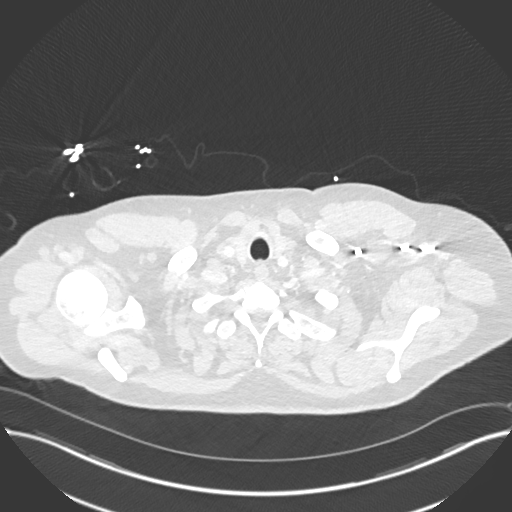

[Series 8: pe 2mm cor · coronal · 0.49mm/px · 1 of 151 slices shown]
[im 76/151  mediastinal]
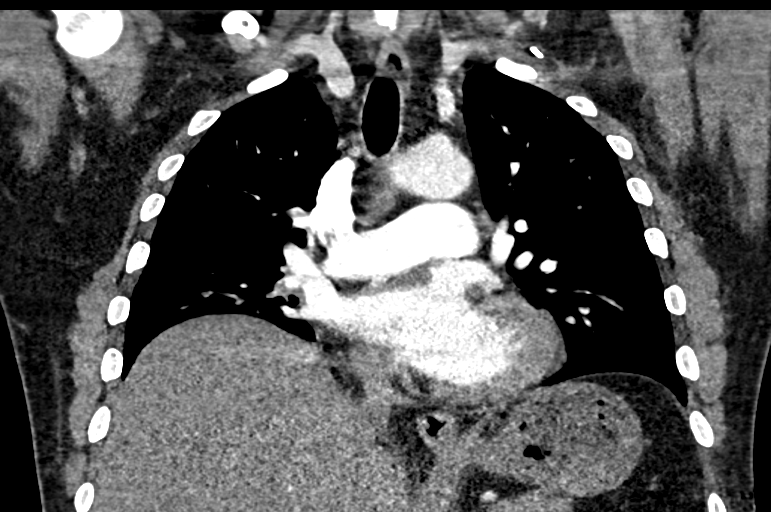

[18 of 36 positions shown; findings below may reference images not displayed]

FINDINGS: Cardiovascular: There is no cardiomegaly or pericardial effusion.
The thoracic aorta is unremarkable. The origins of the great vessels
of the aortic arch are patent. There is a large right pulmonary
artery embolus involving the lobar branches of the middle and lower
lobes extending into the segmental and subsegmental branches. Left
lower lobe segmental pulmonary artery emboli noted. The right
ventricle measures 4.2 cm in diameter and the left ventricle
measures 4.6 cm in diameter (series 7 image 235).

Mediastinum/Nodes: No hilar or mediastinal adenopathy. Esophagus and
the thyroid gland are grossly unremarkable. No mediastinal fluid
collection.

Lungs/Pleura: There are bibasilar atelectatic changes. Focal area of
hazy and nodular density in the right middle lobe may represent
developing infarct. There is a 3 mm right middle lobe pulmonary
nodule (series 6, image 69). There is no pleural effusion or
pneumothorax. The central airways are patent.

Upper Abdomen: No acute abnormality.

Musculoskeletal: No chest wall abnormality. No acute or significant
osseous findings.

Review of the MIP images confirms the above findings.
IMPRESSION: Bilateral pulmonary artery emboli larger and occlusive on the right.
The RV/LV ratio is 0.9.

Positive for acute PE with CT evidence of right heart strain (RV/LV
Ratio = 0.9) consistent with at least submassive (intermediate risk)
PE. The presence of right heart strain has been associated with an
increased risk of morbidity and mortality. Please activate Code PE
by paging [phone].

These results were called by telephone at the time of interpretation
on 07/20/2018 at [DATE] to Dr. Prada Ujimori, who verbally acknowledged
these results.

## 2019-10-26 ENCOUNTER — Other Ambulatory Visit: Payer: Self-pay

## 2019-10-26 ENCOUNTER — Encounter (HOSPITAL_COMMUNITY): Payer: Self-pay | Admitting: Emergency Medicine

## 2019-10-26 ENCOUNTER — Emergency Department (HOSPITAL_COMMUNITY): Payer: Commercial Managed Care - PPO

## 2019-10-26 ENCOUNTER — Inpatient Hospital Stay (HOSPITAL_COMMUNITY)
Admission: EM | Admit: 2019-10-26 | Discharge: 2019-10-29 | DRG: 872 | Disposition: A | Discharge status: Home / Self Care | Payer: Commercial Managed Care - PPO | Attending: Student | Admitting: Student

## 2019-10-26 DIAGNOSIS — E861 Hypovolemia: Secondary | ICD-10-CM | POA: Diagnosis not present

## 2019-10-26 DIAGNOSIS — D696 Thrombocytopenia, unspecified: Secondary | ICD-10-CM | POA: Diagnosis present

## 2019-10-26 DIAGNOSIS — E860 Dehydration: Secondary | ICD-10-CM | POA: Diagnosis present

## 2019-10-26 DIAGNOSIS — N189 Chronic kidney disease, unspecified: Secondary | ICD-10-CM | POA: Diagnosis present

## 2019-10-26 DIAGNOSIS — E876 Hypokalemia: Secondary | ICD-10-CM | POA: Diagnosis not present

## 2019-10-26 DIAGNOSIS — E785 Hyperlipidemia, unspecified: Secondary | ICD-10-CM | POA: Diagnosis present

## 2019-10-26 DIAGNOSIS — N179 Acute kidney failure, unspecified: Secondary | ICD-10-CM | POA: Diagnosis present

## 2019-10-26 DIAGNOSIS — N3090 Cystitis, unspecified without hematuria: Secondary | ICD-10-CM | POA: Diagnosis present

## 2019-10-26 DIAGNOSIS — E872 Acidosis: Secondary | ICD-10-CM | POA: Diagnosis present

## 2019-10-26 DIAGNOSIS — K529 Noninfective gastroenteritis and colitis, unspecified: Secondary | ICD-10-CM | POA: Diagnosis not present

## 2019-10-26 DIAGNOSIS — Z20828 Contact with and (suspected) exposure to other viral communicable diseases: Secondary | ICD-10-CM | POA: Diagnosis present

## 2019-10-26 DIAGNOSIS — D751 Secondary polycythemia: Secondary | ICD-10-CM | POA: Diagnosis present

## 2019-10-26 DIAGNOSIS — E871 Hypo-osmolality and hyponatremia: Secondary | ICD-10-CM | POA: Diagnosis not present

## 2019-10-26 DIAGNOSIS — Z86711 Personal history of pulmonary embolism: Secondary | ICD-10-CM | POA: Diagnosis not present

## 2019-10-26 DIAGNOSIS — R6520 Severe sepsis without septic shock: Secondary | ICD-10-CM | POA: Diagnosis not present

## 2019-10-26 DIAGNOSIS — Z86718 Personal history of other venous thrombosis and embolism: Secondary | ICD-10-CM | POA: Diagnosis not present

## 2019-10-26 DIAGNOSIS — R112 Nausea with vomiting, unspecified: Secondary | ICD-10-CM | POA: Diagnosis not present

## 2019-10-26 DIAGNOSIS — R197 Diarrhea, unspecified: Secondary | ICD-10-CM | POA: Diagnosis not present

## 2019-10-26 DIAGNOSIS — F1010 Alcohol abuse, uncomplicated: Secondary | ICD-10-CM | POA: Diagnosis present

## 2019-10-26 DIAGNOSIS — A419 Sepsis, unspecified organism: Secondary | ICD-10-CM | POA: Diagnosis not present

## 2019-10-26 DIAGNOSIS — A09 Infectious gastroenteritis and colitis, unspecified: Secondary | ICD-10-CM | POA: Diagnosis not present

## 2019-10-26 LAB — COMPREHENSIVE METABOLIC PANEL
ALT: 19 U/L (ref 0–44)
AST: 34 U/L (ref 15–41)
Albumin: 4.2 g/dL (ref 3.5–5.0)
Alkaline Phosphatase: 54 U/L (ref 38–126)
Anion gap: 19 — ABNORMAL HIGH (ref 5–15)
BUN: 16 mg/dL (ref 6–20)
CO2: 21 mmol/L — ABNORMAL LOW (ref 22–32)
Calcium: 9.8 mg/dL (ref 8.9–10.3)
Chloride: 95 mmol/L — ABNORMAL LOW (ref 98–111)
Creatinine, Ser: 1.86 mg/dL — ABNORMAL HIGH (ref 0.61–1.24)
GFR calc Af Amer: 47 mL/min — ABNORMAL LOW (ref >60)
GFR calc non Af Amer: 41 mL/min — ABNORMAL LOW (ref >60)
Glucose, Bld: 161 mg/dL — ABNORMAL HIGH (ref 70–99)
Potassium: 4.8 mmol/L (ref 3.5–5.1)
Sodium: 135 mmol/L (ref 135–145)
Total Bilirubin: 1 mg/dL (ref 0.3–1.2)
Total Protein: 8.6 g/dL — ABNORMAL HIGH (ref 6.5–8.1)

## 2019-10-26 LAB — CBC WITH DIFFERENTIAL/PLATELET
Abs Immature Granulocytes: 0.03 10*3/uL (ref 0.00–0.07)
Abs Immature Granulocytes: 0 10*3/uL (ref 0.00–0.07)
Basophils Absolute: 0.1 10*3/uL (ref 0.0–0.1)
Basophils Absolute: 0 10*3/uL (ref 0.0–0.1)
Basophils Relative: 2 %
Basophils Relative: 0 %
Eosinophils Absolute: 0 10*3/uL (ref 0.0–0.5)
Eosinophils Absolute: 0 10*3/uL (ref 0.0–0.5)
Eosinophils Relative: 1 %
Eosinophils Relative: 0 %
HCT: 65.5 % — ABNORMAL HIGH (ref 39.0–52.0)
HCT: 57.2 % — ABNORMAL HIGH (ref 39.0–52.0)
Hemoglobin: 21.9 g/dL (ref 13.0–17.0)
Hemoglobin: 19 g/dL — ABNORMAL HIGH (ref 13.0–17.0)
Immature Granulocytes: 0 %
Lymphocytes Relative: 7 %
Lymphocytes Relative: 4 %
Lymphs Abs: 0.6 10*3/uL — ABNORMAL LOW (ref 0.7–4.0)
Lymphs Abs: 0.3 10*3/uL — ABNORMAL LOW (ref 0.7–4.0)
MCH: 30.2 pg (ref 26.0–34.0)
MCH: 30.2 pg (ref 26.0–34.0)
MCHC: 33.4 g/dL (ref 30.0–36.0)
MCHC: 33.2 g/dL (ref 30.0–36.0)
MCV: 90.3 fL (ref 80.0–100.0)
MCV: 90.9 fL (ref 80.0–100.0)
Monocytes Absolute: 0.4 10*3/uL (ref 0.1–1.0)
Monocytes Absolute: 0.3 10*3/uL (ref 0.1–1.0)
Monocytes Relative: 5 %
Monocytes Relative: 4 %
Neutro Abs: 6.9 10*3/uL (ref 1.7–7.7)
Neutro Abs: 6.6 10*3/uL (ref 1.7–7.7)
Neutrophils Relative %: 85 %
Neutrophils Relative %: 92 %
Platelets: 167 10*3/uL (ref 150–400)
Platelets: 148 10*3/uL — ABNORMAL LOW (ref 150–400)
RBC: 7.25 MIL/uL — ABNORMAL HIGH (ref 4.22–5.81)
RBC: 6.29 MIL/uL — ABNORMAL HIGH (ref 4.22–5.81)
RDW: 15.4 % (ref 11.5–15.5)
RDW: 14.3 % (ref 11.5–15.5)
WBC Morphology: INCREASED
WBC: 8.1 10*3/uL (ref 4.0–10.5)
WBC: 7.2 10*3/uL (ref 4.0–10.5)
nRBC: 0 % (ref 0.0–0.2)
nRBC: 0 % (ref 0.0–0.2)
nRBC: 0 /100 WBC

## 2019-10-26 LAB — HIV ANTIBODY (ROUTINE TESTING W REFLEX): HIV Screen 4th Generation wRfx: NONREACTIVE

## 2019-10-26 LAB — URINALYSIS, ROUTINE W REFLEX MICROSCOPIC
Bacteria, UA: NONE SEEN
Bilirubin Urine: NEGATIVE
Glucose, UA: NEGATIVE mg/dL
Ketones, ur: NEGATIVE mg/dL
Leukocytes,Ua: NEGATIVE
Nitrite: NEGATIVE
Protein, ur: 30 mg/dL — AB
Specific Gravity, Urine: 1.021 (ref 1.005–1.030)
pH: 5 (ref 5.0–8.0)

## 2019-10-26 LAB — LACTIC ACID, PLASMA
Lactic Acid, Venous: 2 mmol/L (ref 0.5–1.9)
Lactic Acid, Venous: 1.7 mmol/L (ref 0.5–1.9)
Lactic Acid, Venous: 1.6 mmol/L (ref 0.5–1.9)

## 2019-10-26 LAB — SARS CORONAVIRUS 2 (TAT 6-24 HRS): SARS Coronavirus 2: NEGATIVE

## 2019-10-26 LAB — BRAIN NATRIURETIC PEPTIDE: B Natriuretic Peptide: 112 pg/mL — ABNORMAL HIGH (ref 0.0–100.0)

## 2019-10-26 MED ORDER — ACETAMINOPHEN 650 MG RE SUPP: 650.0000 mg | Freq: Four times a day (QID) | RECTAL | Status: DC | PRN

## 2019-10-26 MED ORDER — HYDROCODONE-ACETAMINOPHEN 5-325 MG PO TABS
1.0000 | ORAL_TABLET | Freq: Four times a day (QID) | ORAL | Status: DC | PRN
  Administered 2019-10-26 (×2): 1 via ORAL
  Filled 2019-10-26 (×2): qty 1

## 2019-10-26 MED ORDER — SODIUM CHLORIDE 0.9 % IV BOLUS
1000.0000 mL | Freq: Once | INTRAVENOUS | Status: AC
  Administered 2019-10-26: 1000 mL via INTRAVENOUS

## 2019-10-26 MED ORDER — METRONIDAZOLE IN NACL 5-0.79 MG/ML-% IV SOLN
500.0000 mg | Freq: Three times a day (TID) | INTRAVENOUS | Status: DC
  Administered 2019-10-27 – 2019-10-28 (×4): 500 mg via INTRAVENOUS
  Filled 2019-10-26 (×4): qty 100

## 2019-10-26 MED ORDER — SODIUM CHLORIDE 0.9% FLUSH
3.0000 mL | Freq: Two times a day (BID) | INTRAVENOUS | Status: DC
  Administered 2019-10-27 – 2019-10-28 (×5): 3 mL via INTRAVENOUS

## 2019-10-26 MED ORDER — ENOXAPARIN SODIUM 40 MG/0.4ML ~~LOC~~ SOLN
40.0000 mg | SUBCUTANEOUS | Status: DC
  Administered 2019-10-26 – 2019-10-28 (×3): 40 mg via SUBCUTANEOUS
  Filled 2019-10-26 (×3): qty 0.4

## 2019-10-26 MED ORDER — MORPHINE SULFATE (PF) 2 MG/ML IV SOLN: 2.0000 mg | INTRAVENOUS | Status: DC | PRN

## 2019-10-26 MED ORDER — DICYCLOMINE HCL 10 MG/ML IM SOLN
20.0000 mg | Freq: Once | INTRAMUSCULAR | Status: AC
  Administered 2019-10-26: 20 mg via INTRAMUSCULAR
  Filled 2019-10-26: qty 2

## 2019-10-26 MED ORDER — ACETAMINOPHEN 325 MG PO TABS
650.0000 mg | ORAL_TABLET | Freq: Four times a day (QID) | ORAL | Status: DC | PRN
  Administered 2019-10-26: 650 mg via ORAL
  Filled 2019-10-26: qty 2

## 2019-10-26 MED ORDER — ONDANSETRON HCL 4 MG/2ML IJ SOLN: 4.0000 mg | Freq: Four times a day (QID) | INTRAMUSCULAR | Status: DC | PRN

## 2019-10-26 MED ORDER — METRONIDAZOLE IN NACL 5-0.79 MG/ML-% IV SOLN
500.0000 mg | Freq: Once | INTRAVENOUS | Status: AC
  Administered 2019-10-26: 500 mg via INTRAVENOUS
  Filled 2019-10-26: qty 100

## 2019-10-26 MED ORDER — SODIUM CHLORIDE 0.9 % IV SOLN
INTRAVENOUS | Status: DC
  Administered 2019-10-26: 17:00:00 via INTRAVENOUS

## 2019-10-26 MED ORDER — CIPROFLOXACIN IN D5W 400 MG/200ML IV SOLN
400.0000 mg | Freq: Once | INTRAVENOUS | Status: AC
  Administered 2019-10-26: 400 mg via INTRAVENOUS
  Filled 2019-10-26: qty 200

## 2019-10-26 MED ORDER — ALBUTEROL SULFATE (2.5 MG/3ML) 0.083% IN NEBU: 2.5000 mg | INHALATION_SOLUTION | Freq: Four times a day (QID) | RESPIRATORY_TRACT | Status: DC | PRN

## 2019-10-26 MED ORDER — CIPROFLOXACIN IN D5W 400 MG/200ML IV SOLN
400.0000 mg | Freq: Two times a day (BID) | INTRAVENOUS | Status: DC
  Administered 2019-10-27: 400 mg via INTRAVENOUS
  Filled 2019-10-26: qty 200

## 2019-10-26 MED ORDER — ONDANSETRON HCL 4 MG PO TABS: 4.0000 mg | ORAL_TABLET | Freq: Four times a day (QID) | ORAL | Status: DC | PRN

## 2019-10-26 MED ORDER — SODIUM CHLORIDE 0.9% FLUSH
3.0000 mL | Freq: Once | INTRAVENOUS | Status: AC
  Administered 2019-10-26: 3 mL via INTRAVENOUS

## 2019-10-26 NOTE — ED Notes (Signed)
hgb-21.9

## 2019-10-26 NOTE — ED Notes (Signed)
Dr Vanita Panda aware of latic

## 2019-10-26 NOTE — H&P (Addendum)
History and Physical    Allen Savage. ERX:[id number] DOB: 09-19-1966 DOA: 10/26/2019  Referring MD/NP/PA: Gerhard Munch, MD PCP: Ardith Dark, MD  Patient coming from: Home  Chief Complaint: Abdominal pain and diarrhea  I have personally briefly reviewed patient's old medical records in [facility]   HPI: Allen Savage. is a 53 y.o. male with medical history significant of DVT/PE, but not currently on anticoagulation.  He presents with complaints of abdominal pain and diarrhea.  Symptoms started 2 days ago after he had gotten off work.  Pain was located in the middle of his abdomen.  He reports having poor appetite, nausea, 1 episode of vomiting, and diarrhea.  Patient reports having used the bathroom 1-2 times every hour.  Denies having any fluid, but stools have been black.  He notes that he had been using Pepto-Bismol without relief of symptoms.  His girlfriend with whom he lives had been sent home last week due to positive coworker exposure with coronavirus.  Denies having any significant shortness of breath, cough, dysuria, or urinary frequency.  ED Course: Upon admission to the emergency department patient was noted to be febrile up to 100.3 F, tachycardic, and tachypneic.  Labs significant for WBC 7.2, hemoglobin 21.9, platelets 148, BUN 16, creatinine 1.86, and lactic acid 2-> 1.6.  CT scan of the abdomen and pelvis revealed signs of enterocolitis and bladder wall thickening concerning for cystitis.  Urinalysis was still pending.  Patient was given 2 L normal saline IV fluids ciprofloxacin, and metronidazole  Review of Systems  Constitutional: Positive for malaise/fatigue. Negative for fever.  HENT: Negative for nosebleeds and sinus pain.   Eyes: Negative for double vision and photophobia.  Respiratory: Negative for cough and shortness of breath.   Cardiovascular: Negative for chest pain and leg swelling.  Gastrointestinal: Positive for abdominal pain, diarrhea and  nausea. Negative for vomiting.  Genitourinary: Negative for dysuria and frequency.  Musculoskeletal: Negative for joint pain and myalgias.  Skin: Negative for rash.  Neurological: Positive for weakness. Negative for focal weakness.  Psychiatric/Behavioral: Negative for substance abuse.    Past Medical History:  Diagnosis Date   DVT (deep venous thrombosis) ([facility])    History of pulmonary embolus (PE) 06/2018   from DVT in right calf    Past Surgical History:  Procedure Laterality Date   SHOULDER SURGERY Bilateral      reports that he has never smoked. He has never used smokeless tobacco. He reports current alcohol use of about 1.0 standard drinks of alcohol per week. He reports that he does not use drugs.  No Known Allergies  Family History  Problem Relation Age of Onset   Diabetes Father    Colon cancer Neg Hx    Esophageal cancer Neg Hx    Rectal cancer Neg Hx    Stomach cancer Neg Hx     Prior to Admission medications   Medication Sig Start Date End Date Taking? Authorizing Provider  amitriptyline (ELAVIL) 50 MG tablet TAKE 1 TABLET(50 MG) BY MOUTH AT BEDTIME Patient not taking: Reported on 10/26/2019 09/01/19   Ardith Dark, MD    Physical Exam:  Constitutional: Middle-aged male currently in NAD, calm, comfortable Vitals:   10/26/19 0831 10/26/19 1100 10/26/19 1115 10/26/19 1158  BP: (!) 140/104 137/82 133/82 (!) 141/100  Pulse: (!) 142 (!) 125 (!) 126 (!) 123  Resp: (!) 26 (!) 24 20 18   Temp: 100.3 F (37.9 C)     TempSrc:  Oral     SpO2: 97% 98% 98% 97%   Eyes: PERRL, lids and conjunctivae normal ENMT: Mucous membranes are dry. Posterior pharynx clear of any exudate or lesions. Neck: normal, supple, no masses, no thyromegaly Respiratory: clear to auscultation bilaterally, no wheezing, no crackles. Normal respiratory effort. No accessory muscle use.  Cardiovascular: Regular rate and rhythm, no murmurs / rubs / gallops. No extremity edema. 2+ pedal  pulses. No carotid bruits.  Abdomen: no tenderness, no masses palpated. No hepatosplenomegaly. Bowel sounds positive.  Musculoskeletal: no clubbing / cyanosis. No joint deformity upper and lower extremities. Good ROM, no contractures. Normal muscle tone.  Skin: no rashes, lesions, ulcers. No induration Neurologic: CN 2-12 grossly intact. Sensation intact, DTR normal. Strength 5/5 in all 4.  Psychiatric: Normal judgment and insight. Alert and oriented x 3. Normal mood.     Labs on Admission: I have personally reviewed following labs and imaging studies  CBC: Recent Labs  Lab 10/26/19 1000 10/26/19 1330  WBC 8.1 7.2  NEUTROABS 6.9 6.6  HGB 21.9* 19.0*  HCT 65.5* 57.2*  MCV 90.3 90.9  PLT 167 148*   Basic Metabolic Panel: Recent Labs  Lab 10/26/19 1000  NA 135  K 4.8  CL 95*  CO2 21*  GLUCOSE 161*  BUN 16  CREATININE 1.86*  CALCIUM 9.8   GFR: CrCl cannot be calculated (Unknown ideal weight.). Liver Function Tests: Recent Labs  Lab 10/26/19 1000  AST 34  ALT 19  ALKPHOS 54  BILITOT 1.0  PROT 8.6*  ALBUMIN 4.2   No results for input(s): LIPASE, AMYLASE in the last 168 hours. No results for input(s): AMMONIA in the last 168 hours. Coagulation Profile: No results for input(s): INR, PROTIME in the last 168 hours. Cardiac Enzymes: No results for input(s): CKTOTAL, CKMB, CKMBINDEX, TROPONINI in the last 168 hours. BNP (last 3 results) No results for input(s): PROBNP in the last 8760 hours. HbA1C: No results for input(s): HGBA1C in the last 72 hours. CBG: No results for input(s): GLUCAP in the last 168 hours. Lipid Profile: No results for input(s): CHOL, HDL, LDLCALC, TRIG, CHOLHDL, LDLDIRECT in the last 72 hours. Thyroid Function Tests: No results for input(s): TSH, T4TOTAL, FREET4, T3FREE, THYROIDAB in the last 72 hours. Anemia Panel: No results for input(s): VITAMINB12, FOLATE, FERRITIN, TIBC, IRON, RETICCTPCT in the last 72 hours. Urine analysis:      Component Value Date/Time   COLORURINE AMBER (A) 07/29/2015 0814   APPEARANCEUR CLEAR 07/29/2015 0814   LABSPEC 1.024 07/29/2015 0814   PHURINE 5.5 07/29/2015 0814   GLUCOSEU NEGATIVE 07/29/2015 0814   HGBUR SMALL (A) 07/29/2015 0814   BILIRUBINUR Negative 08/12/2019 1351   KETONESUR 15 (A) 07/29/2015 0814   PROTEINUR Negative 08/12/2019 1351   PROTEINUR 30 (A) 07/29/2015 0814   UROBILINOGEN 0.2 08/12/2019 1351   UROBILINOGEN 0.2 07/29/2015 0814   NITRITE Negative 08/12/2019 1351   NITRITE NEGATIVE 07/29/2015 0814   LEUKOCYTESUR Negative 08/12/2019 1351   Sepsis Labs: No results found for this or any previous visit (from the past 240 hour(s)).   Radiological Exams on Admission: Ct Abdomen Pelvis Wo Contrast  Result Date: 10/26/2019 CLINICAL DATA:  Abdominal pain, diarrhea EXAM: CT ABDOMEN AND PELVIS WITHOUT CONTRAST TECHNIQUE: Multidetector CT imaging of the abdomen and pelvis was performed following the standard protocol without IV contrast. COMPARISON:  08/07/2007 FINDINGS: Lower chest: Mild right basilar atelectasis. No acute findings within the visualized lung bases. Hepatobiliary: No focal liver abnormality is seen. No gallstones, gallbladder wall  thickening, or biliary dilatation. Pancreas: Unremarkable. No pancreatic ductal dilatation or surrounding inflammatory changes. Spleen: Normal in size without focal abnormality. Adrenals/Urinary Tract: Adrenal glands are unremarkable. Kidneys are normal, without renal calculi, focal lesion, or hydronephrosis. Circumferential urinary bladder wall thickening. Bladder contents are high in attenuation. Stomach/Bowel: There is diffuse wall thickening throughout the colon. There are a few scattered colonic diverticula without focal pericolonic inflammatory changes to suggest diverticulitis. There is long segment thickening of the distal small bowel. There is no obstruction. Normal appendix is present in the right lower quadrant. Vascular/Lymphatic:  No significant vascular findings are present. No enlarged abdominal or pelvic lymph nodes. Reproductive: Prostate is unremarkable. Other: Small fat containing right inguinal hernia. Small fat containing umbilical hernia. No ascites. Musculoskeletal: No acute or significant osseous findings. IMPRESSION: 1. Circumferential urinary bladder wall thickening suggesting cystitis. High attenuation bladder contents may represent blood products or proteinaceous fluid/debris. Correlation with urinalysis is recommended. 2. Diffuse long segment thickening of the colon and distal small bowel. Findings may represent an infectious or inflammatory enterocolitis. 3. There are a few scattered colonic diverticula without focal pericolonic inflammatory changes to suggest diverticulitis. 4. Small fat containing right inguinal hernia. Small fat containing umbilical hernia. Electronically Signed   By: Davina Poke M.D.   On: 10/26/2019 15:45   Dg Chest Portable 1 View  Result Date: 10/26/2019 CLINICAL DATA:  Shortness of breath EXAM: PORTABLE CHEST 1 VIEW COMPARISON:  July 20, 2018 FINDINGS: Lungs are clear. Heart size and pulmonary vascularity are normal. No adenopathy. No pneumothorax. No bone lesions. IMPRESSION: No edema or consolidation. Electronically Signed   By: Lowella Grip III M.D.   On: 10/26/2019 09:03    EKG: Independently reviewed.  Sinus tachycardia at 142 bpm  Assessment/Plan  Sepsis 2/2 enterocolitis: Patient presents with complaints of abdominal pain and diarrhea.  On admission found to to have a temperature temperature 100.3 F, tachycardia, and tachypnea.  CBC was within normal limits, but lactic acid elevated at 2.  CT scan of the abdomen and pelvis revealed signs of enterocolitis and cystitis signs of endorgan damage including acute kidney injury. -Admit medical telemetry bed -Follow-up blood cultures  -Monitor intake and output -Continue empiric ciprofloxacin and metronidazole -Advance diet  as tolerated  Suspected cystitis: Acute.  Patient found to have signs of cystitis on CT. He denies any urinary frequency or dysuria symptoms. -Follow-up urinalysis -Check urine culture -Empiric antibiotics as seen above  Acute kidney injury: Patient presents with creatinine elevated up to 1.86 with BUN 16.  Suspect patient is significantly dehydrated given the signs of hemoconcentration and tachycardia.  Patient was given 2 L normal saline IV fluids in the ED. -Bolus 1 L normal saline IV fluids, placed on rate 100 mL/h -Recheck BMP in a.m.  History of DVT/PE: Patient no longer on anticoagulation.  Recent COVID-19 exposure: Patient's girlfriend have been sent home last week due to possible exposure. -Follow-up COVID-19 screening  DVT prophylaxis: lovenox Code Status: Full Family Communication: No family present at bedside Disposition Plan: Likely discharge home in 2 to 3 days once medically stable Consults called: None Admission status: inpatient  Norval Morton MD [facility] Pager [phone]   If 7PM-7AM, please contact night-coverage [email or web address] Password [facility]  10/26/2019, 4:07 PM

## 2019-10-26 NOTE — ED Provider Notes (Signed)
[facility] EMERGENCY DEPARTMENT Provider Note   CSN: [id number] Arrival date & time: 10/26/19  0818     History   Chief Complaint Chief Complaint  Patient presents with   Shortness of Breath   Abdominal Pain    HPI Allen Savage. is a 53 y.o. male.     HPI  Patient presents with concern of multiple complaints. He notes that he was in his usual state of health until about 48 hours ago. He does have positive sick contacts, possibly.  His girlfriend with whom he lives, was sent home from work and of last week due to a positive coworker for coronavirus. Patient states that he is generally well, does have a history of DVT/PE about 1 year ago, is not currently taking anticoagulant. He drinks socially, does not smoke, works as a Midwife. He was at work, when he suddenly felt poorly about 48 hours ago. Since that time he has had persistent generalized discomfort, abdominal discomfort, nausea, vomiting, diarrhea, anorexia, fatigue.  No relief in spite of OTC medication, rest.  Past Medical History:  Diagnosis Date   DVT (deep venous thrombosis) (HCC)    History of pulmonary embolus (PE) 06/2018   from DVT in right calf    Patient Active Problem List   Diagnosis Date Noted   Colon cancer screening 08/25/2019   Chronic anticoagulation 08/25/2019   Dyslipidemia 08/12/2018   Deep vein thrombosis (DVT) of distal vein of lower extremity (HCC) 07/29/2018   Premature ejaculation 07/29/2018    Past Surgical History:  Procedure Laterality Date   SHOULDER SURGERY Bilateral         Home Medications    Prior to Admission medications   Medication Sig Start Date End Date Taking? Authorizing Provider  amitriptyline (ELAVIL) 50 MG tablet TAKE 1 TABLET(50 MG) BY MOUTH AT BEDTIME Patient not taking: Reported on 10/26/2019 09/01/19   Ardith Dark, MD    Family History Family History  Problem Relation Age of Onset   Diabetes Father    Colon  cancer Neg Hx    Esophageal cancer Neg Hx    Rectal cancer Neg Hx    Stomach cancer Neg Hx     Social History Social History   Tobacco Use   Smoking status: Never Smoker   Smokeless tobacco: Never Used  Substance Use Topics   Alcohol use: Yes    Alcohol/week: 1.0 standard drinks    Types: 1 Glasses of wine per week   Drug use: No     Allergies   Patient has no known allergies.   Review of Systems Review of Systems  Constitutional:       Per HPI, otherwise negative  HENT:       Per HPI, otherwise negative  Respiratory:       Per HPI, otherwise negative  Cardiovascular:       Per HPI, otherwise negative  Gastrointestinal: Positive for abdominal pain, diarrhea, nausea and vomiting.       Dark stool  Endocrine:       Negative aside from HPI  Genitourinary:       Neg aside from HPI   Musculoskeletal:       Per HPI, otherwise negative  Skin: Negative.   Allergic/Immunologic: Negative for immunocompromised state.  Neurological: Positive for weakness. Negative for syncope.  Hematological:       Per HPI     Physical Exam Updated Vital Signs BP (!) 141/100    Pulse Marland Kitchen)  123    Temp 100.3 F (37.9 C) (Oral)    Resp 18    SpO2 97%   Physical Exam Vitals signs and nursing note reviewed.  Constitutional:      General: He is not in acute distress.    Appearance: He is well-developed.  HENT:     Head: Normocephalic and atraumatic.  Eyes:     Conjunctiva/sclera: Conjunctivae normal.  Cardiovascular:     Rate and Rhythm: Regular rhythm. Tachycardia present.  Pulmonary:     Effort: Pulmonary effort is normal. Tachypnea present. No accessory muscle usage or respiratory distress.     Breath sounds: No stridor. No wheezing.  Abdominal:     General: There is no distension.  Musculoskeletal:     Comments: Legs are symmetric, no swelling in either distal lower extremity, patient notes no changes similar to those he experienced 1 year ago during DVT diagnosis.    Skin:    General: Skin is warm and dry.  Neurological:     Mental Status: He is alert and oriented to person, place, and time.      ED Treatments / Results  Labs (all labs ordered are listed, but only abnormal results are displayed) Labs Reviewed  LACTIC ACID, PLASMA - Abnormal; Notable for the following components:      Result Value   Lactic Acid, Venous 2.0 (*)    All other components within normal limits  COMPREHENSIVE METABOLIC PANEL - Abnormal; Notable for the following components:   Chloride 95 (*)    CO2 21 (*)    Glucose, Bld 161 (*)    Creatinine, Ser 1.86 (*)    Total Protein 8.6 (*)    GFR calc non Af Amer 41 (*)    GFR calc Af Amer 47 (*)    Anion gap 19 (*)    All other components within normal limits  CBC WITH DIFFERENTIAL/PLATELET - Abnormal; Notable for the following components:   RBC 7.25 (*)    Hemoglobin 21.9 (*)    HCT 65.5 (*)    Lymphs Abs 0.6 (*)    All other components within normal limits  BRAIN NATRIURETIC PEPTIDE - Abnormal; Notable for the following components:   B Natriuretic Peptide 112.0 (*)    All other components within normal limits  CBC WITH DIFFERENTIAL/PLATELET - Abnormal; Notable for the following components:   RBC 6.29 (*)    Hemoglobin 19.0 (*)    HCT 57.2 (*)    Platelets 148 (*)    Lymphs Abs 0.3 (*)    All other components within normal limits  SARS CORONAVIRUS 2 (TAT 6-24 HRS)  LACTIC ACID, PLASMA  LACTIC ACID, PLASMA  URINALYSIS, ROUTINE W REFLEX MICROSCOPIC    EKG EKG Interpretation  Date/Time:  Wednesday October 26 2019 08:27:18 EST Ventricular Rate:  142 PR Interval:  128 QRS Duration: 74 QT Interval:  292 QTC Calculation: 449 R Axis:   68 Text Interpretation: Sinus tachycardia Right atrial enlargement ST-t wave abnormality Abnormal ECG Confirmed by Carmin Muskrat [phone]) on 10/26/2019 8:36:36 AM   Radiology Ct Abdomen Pelvis Wo Contrast  Result Date: 10/26/2019 CLINICAL DATA:  Abdominal pain, diarrhea  EXAM: CT ABDOMEN AND PELVIS WITHOUT CONTRAST TECHNIQUE: Multidetector CT imaging of the abdomen and pelvis was performed following the standard protocol without IV contrast. COMPARISON:  08/07/2007 FINDINGS: Lower chest: Mild right basilar atelectasis. No acute findings within the visualized lung bases. Hepatobiliary: No focal liver abnormality is seen. No gallstones, gallbladder wall thickening, or biliary  dilatation. Pancreas: Unremarkable. No pancreatic ductal dilatation or surrounding inflammatory changes. Spleen: Normal in size without focal abnormality. Adrenals/Urinary Tract: Adrenal glands are unremarkable. Kidneys are normal, without renal calculi, focal lesion, or hydronephrosis. Circumferential urinary bladder wall thickening. Bladder contents are high in attenuation. Stomach/Bowel: There is diffuse wall thickening throughout the colon. There are a few scattered colonic diverticula without focal pericolonic inflammatory changes to suggest diverticulitis. There is long segment thickening of the distal small bowel. There is no obstruction. Normal appendix is present in the right lower quadrant. Vascular/Lymphatic: No significant vascular findings are present. No enlarged abdominal or pelvic lymph nodes. Reproductive: Prostate is unremarkable. Other: Small fat containing right inguinal hernia. Small fat containing umbilical hernia. No ascites. Musculoskeletal: No acute or significant osseous findings. IMPRESSION: 1. Circumferential urinary bladder wall thickening suggesting cystitis. High attenuation bladder contents may represent blood products or proteinaceous fluid/debris. Correlation with urinalysis is recommended. 2. Diffuse long segment thickening of the colon and distal small bowel. Findings may represent an infectious or inflammatory enterocolitis. 3. There are a few scattered colonic diverticula without focal pericolonic inflammatory changes to suggest diverticulitis. 4. Small fat containing right  inguinal hernia. Small fat containing umbilical hernia. Electronically Signed   By: Duanne GuessNicholas  Plundo M.D.   On: 10/26/2019 15:45   Dg Chest Portable 1 View  Result Date: 10/26/2019 CLINICAL DATA:  Shortness of breath EXAM: PORTABLE CHEST 1 VIEW COMPARISON:  July 20, 2018 FINDINGS: Lungs are clear. Heart size and pulmonary vascularity are normal. No adenopathy. No pneumothorax. No bone lesions. IMPRESSION: No edema or consolidation. Electronically Signed   By: Bretta BangWilliam  Woodruff III M.D.   On: 10/26/2019 09:03    Procedures Procedures (including critical care time)  Medications Ordered in ED Medications  ciprofloxacin (CIPRO) IVPB 400 mg (has no administration in time range)  metroNIDAZOLE (FLAGYL) IVPB 500 mg (has no administration in time range)  sodium chloride flush (NS) 0.9 % injection 3 mL (3 mLs Intravenous Given 10/26/19 1155)  sodium chloride 0.9 % bolus 1,000 mL (0 mLs Intravenous Stopped 10/26/19 1115)  dicyclomine (BENTYL) injection 20 mg (20 mg Intramuscular Given 10/26/19 1043)  sodium chloride 0.9 % bolus 1,000 mL (0 mLs Intravenous Stopped 10/26/19 1330)     Initial Impression / Assessment and Plan / ED Course  I have reviewed the triage vital signs and the nursing notes.  Pertinent labs & imaging results that were available during my care of the patient were reviewed by me and considered in my medical decision making (see chart for details).        4:03 PM Now after 2 L fluid resuscitation patient remains tachycardic in the 120s, has mild improvement in his condition. Labs notable for lactic acidosis initially, this is cleared with fluids.  Patient was found have leukocytosis, though this is improved after initial fluids Patient is receiving broad-spectrum antibiotics, due to CT concerning for enteritis.  Given his persistent abnormal vital signs, concern for abdominal infection, dehydration, leukocytosis, the patient will require admission for further monitoring,  management.  Covid test pending on admission.  Final Clinical Impressions(s) / ED Diagnoses   Final diagnoses:  Enterocolitis     Gerhard MunchLockwood, Jowanna Loeffler, MD 10/26/19 1605

## 2019-10-26 NOTE — ED Notes (Signed)
Lab called states will send via tube station a special blue top tube and to place 2.7ML of blood in tube.

## 2019-10-26 NOTE — ED Notes (Signed)
Patient used bathroom and had a bowel movement. Greenish in color per patient small amount liquid.

## 2019-10-26 NOTE — ED Notes (Signed)
Ordered diet tray 

## 2019-10-26 NOTE — ED Notes (Signed)
Dinner Tray Ordered @ [phone].

## 2019-10-26 NOTE — ED Triage Notes (Addendum)
C/o SOB, abd pain, black stools/diarrhea, vomiting, fever, chills, and body aches since yesterday.  Hx of PE in January.  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

## 2019-10-27 ENCOUNTER — Other Ambulatory Visit: Payer: Self-pay

## 2019-10-27 DIAGNOSIS — K529 Noninfective gastroenteritis and colitis, unspecified: Secondary | ICD-10-CM

## 2019-10-27 DIAGNOSIS — D751 Secondary polycythemia: Secondary | ICD-10-CM

## 2019-10-27 DIAGNOSIS — R112 Nausea with vomiting, unspecified: Secondary | ICD-10-CM

## 2019-10-27 DIAGNOSIS — R6520 Severe sepsis without septic shock: Secondary | ICD-10-CM

## 2019-10-27 DIAGNOSIS — E871 Hypo-osmolality and hyponatremia: Secondary | ICD-10-CM

## 2019-10-27 DIAGNOSIS — E876 Hypokalemia: Secondary | ICD-10-CM

## 2019-10-27 DIAGNOSIS — A419 Sepsis, unspecified organism: Principal | ICD-10-CM

## 2019-10-27 DIAGNOSIS — N179 Acute kidney failure, unspecified: Secondary | ICD-10-CM

## 2019-10-27 DIAGNOSIS — A09 Infectious gastroenteritis and colitis, unspecified: Secondary | ICD-10-CM

## 2019-10-27 DIAGNOSIS — D696 Thrombocytopenia, unspecified: Secondary | ICD-10-CM

## 2019-10-27 LAB — BASIC METABOLIC PANEL
Anion gap: 11 (ref 5–15)
BUN: 12 mg/dL (ref 6–20)
CO2: 19 mmol/L — ABNORMAL LOW (ref 22–32)
Calcium: 7.9 mg/dL — ABNORMAL LOW (ref 8.9–10.3)
Chloride: 104 mmol/L (ref 98–111)
Creatinine, Ser: 1.03 mg/dL (ref 0.61–1.24)
GFR calc Af Amer: >60 mL/min (ref >60)
GFR calc non Af Amer: >60 mL/min (ref >60)
Glucose, Bld: 125 mg/dL — ABNORMAL HIGH (ref 70–99)
Potassium: 3.4 mmol/L — ABNORMAL LOW (ref 3.5–5.1)
Sodium: 134 mmol/L — ABNORMAL LOW (ref 135–145)

## 2019-10-27 LAB — CBC
HCT: 53.7 % — ABNORMAL HIGH (ref 39.0–52.0)
Hemoglobin: 17.9 g/dL — ABNORMAL HIGH (ref 13.0–17.0)
MCH: 30.1 pg (ref 26.0–34.0)
MCHC: 33.3 g/dL (ref 30.0–36.0)
MCV: 90.4 fL (ref 80.0–100.0)
Platelets: 112 10*3/uL — ABNORMAL LOW (ref 150–400)
RBC: 5.94 MIL/uL — ABNORMAL HIGH (ref 4.22–5.81)
RDW: 14.2 % (ref 11.5–15.5)
WBC: 6 10*3/uL (ref 4.0–10.5)
nRBC: 0 % (ref 0.0–0.2)

## 2019-10-27 LAB — URINE CULTURE: Culture: NO GROWTH

## 2019-10-27 LAB — RAPID URINE DRUG SCREEN, HOSP PERFORMED

## 2019-10-27 LAB — C DIFFICILE QUICK SCREEN W PCR REFLEX
C Diff antigen: NEGATIVE
C Diff interpretation: NOT DETECTED
C Diff toxin: NEGATIVE

## 2019-10-27 MED ORDER — SODIUM CHLORIDE 0.9 % IV SOLN
2.0000 g | INTRAVENOUS | Status: DC
  Administered 2019-10-27 – 2019-10-28 (×2): 2 g via INTRAVENOUS
  Filled 2019-10-27: qty 20
  Filled 2019-10-27 (×2): qty 2

## 2019-10-27 MED ORDER — POTASSIUM CHLORIDE CRYS ER 20 MEQ PO TBCR
40.0000 meq | EXTENDED_RELEASE_TABLET | Freq: Once | ORAL | Status: AC
  Administered 2019-10-27: 40 meq via ORAL
  Filled 2019-10-27: qty 2

## 2019-10-27 MED ORDER — POTASSIUM CHLORIDE IN NACL 20-0.9 MEQ/L-% IV SOLN
INTRAVENOUS | Status: DC
  Administered 2019-10-27 – 2019-10-29 (×6): via INTRAVENOUS
  Filled 2019-10-27 (×5): qty 1000

## 2019-10-27 NOTE — Progress Notes (Addendum)
PROGRESS NOTE  Allen Savage. EYC:[id number] DOB: 1966-06-24   PCP: Ardith Dark, MD  Patient is from: Home  DOA: 10/26/2019 LOS: 1  Brief Narrative / Interim history: 53 year old male with history of DVT/PE no longer on anticoagulation presented 10/26/2019 with 2 days of nausea, abdominal pain, watery diarrhea, fever and poor appetite.  Also had an episode of emesis.  Had a screening colonoscopy on 09/09/2019 that revealed nonbleeding internal hemorrhoid and diverticulosis but no other finding.  No recent antibiotics or unusual food.  No family history of colon cancer or IBD.  In ED, T-max 100.3.  Tachycardic, tachypneic and slightly hypertensive.  Hgb 22.  Platelet 144.  Creatinine 1.86 (baseline 0.9-1.0).  BUN 16.  LA 2.0> 1.6.  BNP 112.  CXR negative.  EKG sinus tachycardia with P pulmonale.  CT abdomen and pelvis concerning for enterocolitis and cystitis.  UA with moderate Hgb but no other significant finding.  COVID-19 negative.  Admitted for enterocolitis, diarrhea, AKI and possible cystitis.  Urine and blood cultures sent.  Started on IV fluids, Cipro and Flagyl.   Subjective: Continues to have frequent watery bowel movements and abdominal pain.  Nausea improved.  Denies hematochezia.  Never had similar problem before.  Objective: Vitals:   10/27/19 0015 10/27/19 0106 10/27/19 0444 10/27/19 0834  BP: 129/83 (!) 144/97 121/82 (!) 142/85  Pulse: 94 (!) 102 (!) 102 (!) 109  Resp: (!) 24 18 18 18   Temp:  98.9 F (37.2 C) 98 F (36.7 C) 98 F (36.7 C)  TempSrc:  Oral  Oral  SpO2: 97% 97% 98% 100%  Weight:  90.4 kg    Height:  5\' 9"  (1.753 m)      Intake/Output Summary (Last 24 hours) at 10/27/2019 1008 Last data filed at 10/27/2019 0600 Gross per 24 hour  Intake 2535.53 ml  Output 0 ml  Net 2535.53 ml   Filed Weights   10/26/19 1600 10/27/19 0106  Weight: 96.1 kg 90.4 kg    Examination:  GENERAL: No acute distress.  Appears well.  HEENT: MMM.  Vision and hearing  grossly intact.  NECK: Supple.  No apparent JVD.  RESP:  No IWOB. Good air movement bilaterally. CVS:  RRR. Heart sounds normal.  ABD/GI/GU: Bowel sounds present. Soft.  Diffuse tenderness but no guarding or rebound. MSK/EXT:  No apparent deformity or edema. Moves extremities. SKIN: no apparent skin lesion or wound NEURO: Awake, alert and oriented appropriately.  No gross deficit.  PSYCH: Calm. Normal affect.  Assessment & Plan: Nausea/emesis/abdominal pain/diarrhea: could be due to enterocolitis as noted on CT abdomen and pelvis.  Sepsis with AKI due to enterocolitis-suspected infectious enterocolitis/gastroenteritis: sepsis resolved. -colonoscopy in October with nonbleeding hemorrhoid and diverticulosis but no other significant finding. -Obtain C. difficile, GI panel and a stool chemistry. -Cipro 12/2-12/3>> CTX 12/3>> -Flagyl 12/2> -Follow urine and blood cultures. -Changed NS to NS-KCl at increased rate  Polycythemia: likely secondary from dehydration.  Never a smoker.  No history of COPD.  No diagnosis of OSA but reports snoring and daytime fatigue occasionally.  Drinks a couple of beers and a couple of shots after work.  Hgb 22> 18>> improving.  Baseline 16-17. -IV fluid as above. -Continue monitoring -Consider sleep study and hematology outpatient.  Hypokalemia: Likely due to GI loss -Replenish and recheck  Hyponatremia: Likely hypovolemic from GI loss -IV fluid as above -Continue monitoring  AKI: Resolved.  Likely prerenal from GI loss.  Not on nephrotoxic meds -IV fluid as above  Elevated BNP: Likely due to slow renal clearance in the setting of AKI.  Has no cardiopulmonary symptoms. -No further work-up warranted  Lactic acidosis: Likely due to dehydration.  Resolved with hydration.  Thrombocytopenia: Dilutional?  To etiology for HIT. -Continue monitoring  History of DVT/PE-no longer on anticoagulation.  Recent COVID-19 exposure: Reportedly girlfriend was sent  home from work last week due to possible exposure -COVID-19 negative here  Alcohol use disorder: reports drinking 2 beers and 2 shots on daily basis after work.  Denies withdrawal symptoms. -monitor for withdrawal symptoms               DVT prophylaxis: Subcu Lovenox Code Status: Full code Family Communication: Patient and/or RN. Available if any question.  Disposition Plan: Remains inpatient Consultants: None  Procedures:  None  Microbiology summarized: COVID-19 negative Blood culture pending Urine culture pending C. difficile pending GI panel pending  Sch Meds:  Scheduled Meds:  enoxaparin (LOVENOX) injection  40 mg Subcutaneous Q24H   sodium chloride flush  3 mL Intravenous Q12H   Continuous Infusions:  0.9 % NaCl with KCl 20 mEq / L 125 mL/hr at 10/27/19 0805   cefTRIAXone (ROCEPHIN)  IV     metronidazole Stopped (10/27/19 0352)   PRN Meds:.acetaminophen **OR** acetaminophen, albuterol, HYDROcodone-acetaminophen, morphine injection, ondansetron **OR** ondansetron (ZOFRAN) IV  Antimicrobials: Anti-infectives (From admission, onward)   Start     Dose/Rate Route Frequency Ordered Stop   10/27/19 2000  cefTRIAXone (ROCEPHIN) 2 g in sodium chloride 0.9 % 100 mL IVPB     2 g 200 mL/hr over 30 Minutes Intravenous Every 24 hours 10/27/19 0904     10/27/19 0800  ciprofloxacin (CIPRO) IVPB 400 mg  Status:  Discontinued     400 mg 200 mL/hr over 60 Minutes Intravenous Every 12 hours 10/26/19 1622 10/27/19 0903   10/27/19 0200  metroNIDAZOLE (FLAGYL) IVPB 500 mg     500 mg 100 mL/hr over 60 Minutes Intravenous Every 8 hours 10/26/19 1622     10/26/19 1600  ciprofloxacin (CIPRO) IVPB 400 mg     400 mg 200 mL/hr over 60 Minutes Intravenous  Once 10/26/19 1553 10/26/19 1832   10/26/19 1600  metroNIDAZOLE (FLAGYL) IVPB 500 mg     500 mg 100 mL/hr over 60 Minutes Intravenous  Once 10/26/19 1553 10/26/19 1824       I have personally reviewed the following labs  and images: CBC: Recent Labs  Lab 10/26/19 1000 10/26/19 1330 10/27/19 0128  WBC 8.1 7.2 6.0  NEUTROABS 6.9 6.6  --   HGB 21.9* 19.0* 17.9*  HCT 65.5* 57.2* 53.7*  MCV 90.3 90.9 90.4  PLT 167 148* 112*   BMP &GFR Recent Labs  Lab 10/26/19 1000 10/27/19 0128  NA 135 134*  K 4.8 3.4*  CL 95* 104  CO2 21* 19*  GLUCOSE 161* 125*  BUN 16 12  CREATININE 1.86* 1.03  CALCIUM 9.8 7.9*   Estimated Creatinine Clearance: 94.3 mL/min (by C-G formula based on SCr of 1.03 mg/dL). Liver & Pancreas: Recent Labs  Lab 10/26/19 1000  AST 34  ALT 19  ALKPHOS 54  BILITOT 1.0  PROT 8.6*  ALBUMIN 4.2   No results for input(s): LIPASE, AMYLASE in the last 168 hours. No results for input(s): AMMONIA in the last 168 hours. Diabetic: No results for input(s): HGBA1C in the last 72 hours. No results for input(s): GLUCAP in the last 168 hours. Cardiac Enzymes: No results for input(s): CKTOTAL, CKMB, CKMBINDEX, TROPONINI in  the last 168 hours. No results for input(s): PROBNP in the last 8760 hours. Coagulation Profile: No results for input(s): INR, PROTIME in the last 168 hours. Thyroid Function Tests: No results for input(s): TSH, T4TOTAL, FREET4, T3FREE, THYROIDAB in the last 72 hours. Lipid Profile: No results for input(s): CHOL, HDL, LDLCALC, TRIG, CHOLHDL, LDLDIRECT in the last 72 hours. Anemia Panel: No results for input(s): VITAMINB12, FOLATE, FERRITIN, TIBC, IRON, RETICCTPCT in the last 72 hours. Urine analysis:    Component Value Date/Time   COLORURINE YELLOW 10/26/2019 0835   APPEARANCEUR HAZY (A) 10/26/2019 0835   LABSPEC 1.021 10/26/2019 0835   PHURINE 5.0 10/26/2019 0835   GLUCOSEU NEGATIVE 10/26/2019 0835   HGBUR MODERATE (A) 10/26/2019 0835   BILIRUBINUR NEGATIVE 10/26/2019 0835   BILIRUBINUR Negative 08/12/2019 1351   KETONESUR NEGATIVE 10/26/2019 0835   PROTEINUR 30 (A) 10/26/2019 0835   UROBILINOGEN 0.2 08/12/2019 1351   UROBILINOGEN 0.2 07/29/2015 0814    NITRITE NEGATIVE 10/26/2019 0835   LEUKOCYTESUR NEGATIVE 10/26/2019 0835   Sepsis Labs: Invalid input(s): PROCALCITONIN, LACTICIDVEN  Microbiology: Recent Results (from the past 240 hour(s))  SARS CORONAVIRUS 2 (TAT 6-24 HRS) Nasopharyngeal Nasopharyngeal Swab     Status: None   Collection Time: 10/26/19  1:55 PM   Specimen: Nasopharyngeal Swab  Result Value Ref Range Status   SARS Coronavirus 2 NEGATIVE NEGATIVE Final    Comment: (NOTE) SARS-CoV-2 target nucleic acids are NOT DETECTED. The SARS-CoV-2 RNA is generally detectable in upper and lower respiratory specimens during the acute phase of infection. Negative results do not preclude SARS-CoV-2 infection, do not rule out co-infections with other pathogens, and should not be used as the sole basis for treatment or other patient management decisions. Negative results must be combined with clinical observations, patient history, and epidemiological information. The expected result is Negative. Fact Sheet for Patients: HairSlick.nohttps://www.fda.gov/media/138098/download Fact Sheet for Healthcare Providers: quierodirigir.comhttps://www.fda.gov/media/138095/download This test is not yet approved or cleared by the [place] FDA and  has been authorized for detection and/or diagnosis of SARS-CoV-2 by FDA under an Emergency Use Authorization (EUA). This EUA will remain  in effect (meaning this test can be used) for the duration of the COVID-19 declaration under Section 56 4(b)(1) of the Act, 21 U.S.C. section 360bbb-3(b)(1), unless the authorization is terminated or revoked sooner. Performed at [facility], [address] [place], [place] [address]   Culture, blood (routine x 2)     Status: None (Preliminary result)   Collection Time: 10/26/19  4:55 PM   Specimen: BLOOD  Result Value Ref Range Status   Specimen Description BLOOD RIGHT ANTECUBITAL  Final   Special Requests   Final    BOTTLES DRAWN AEROBIC AND ANAEROBIC Blood Culture results  may not be optimal due to an inadequate volume of blood received in culture bottles   Culture   Final    NO GROWTH < 24 HOURS Performed at [facility], [address] [place], [place] [address]    Report Status PENDING  Incomplete    Radiology Studies: Ct Abdomen Pelvis Wo Contrast  Result Date: 10/26/2019 CLINICAL DATA:  Abdominal pain, diarrhea EXAM: CT ABDOMEN AND PELVIS WITHOUT CONTRAST TECHNIQUE: Multidetector CT imaging of the abdomen and pelvis was performed following the standard protocol without IV contrast. COMPARISON:  08/07/2007 FINDINGS: Lower chest: Mild right basilar atelectasis. No acute findings within the visualized lung bases. Hepatobiliary: No focal liver abnormality is seen. No gallstones, gallbladder wall thickening, or biliary dilatation. Pancreas: Unremarkable. No  pancreatic ductal dilatation or surrounding inflammatory changes. Spleen: Normal in size without focal abnormality. Adrenals/Urinary Tract: Adrenal glands are unremarkable. Kidneys are normal, without renal calculi, focal lesion, or hydronephrosis. Circumferential urinary bladder wall thickening. Bladder contents are high in attenuation. Stomach/Bowel: There is diffuse wall thickening throughout the colon. There are a few scattered colonic diverticula without focal pericolonic inflammatory changes to suggest diverticulitis. There is long segment thickening of the distal small bowel. There is no obstruction. Normal appendix is present in the right lower quadrant. Vascular/Lymphatic: No significant vascular findings are present. No enlarged abdominal or pelvic lymph nodes. Reproductive: Prostate is unremarkable. Other: Small fat containing right inguinal hernia. Small fat containing umbilical hernia. No ascites. Musculoskeletal: No acute or significant osseous findings. IMPRESSION: 1. Circumferential urinary bladder wall thickening suggesting cystitis. High attenuation bladder contents may represent blood products  or proteinaceous fluid/debris. Correlation with urinalysis is recommended. 2. Diffuse long segment thickening of the colon and distal small bowel. Findings may represent an infectious or inflammatory enterocolitis. 3. There are a few scattered colonic diverticula without focal pericolonic inflammatory changes to suggest diverticulitis. 4. Small fat containing right inguinal hernia. Small fat containing umbilical hernia. Electronically Signed   By: Davina Poke M.D.   On: 10/26/2019 15:45   35 minutes with more than 50% spent in reviewing records, counseling patient/family and coordinating care.  Deshawn Skelley T. [place]  If 7PM-7AM, please contact night-coverage [email or web address] Password [email or web address] 10/27/2019, 10:08 AM

## 2019-10-27 NOTE — Plan of Care (Signed)
  Problem: Education: Goal: Knowledge of General Education information will improve Description: Including pain rating scale, medication(s)/side effects and non-pharmacologic comfort measures Outcome: Progressing   Problem: Clinical Measurements: Goal: Will remain free from infection Outcome: Progressing   Problem: Nutrition: Goal: Adequate nutrition will be maintained Outcome: Progressing   

## 2019-10-27 NOTE — Progress Notes (Signed)
New Admission Note:   Arrival Method: Arrived from ED via wheelchair. Mental Orientation: Alert and oriented x4 Telemetry: Box #18 Assessment: Completed Skin: Intact IV: Rt Hand,Rt AC Pain: Denies Tubes: N/A Safety Measures: Safety Fall Prevention Plan has been discussed.  Admission: Completed 5MW Orientation: Patient has been orientated to the room, unit and staff.  Family: None at bedside  Orders have been reviewed and implemented. Will continue to monitor the patient. Call light has been placed within reach and bed alarm has been activated.   Cambreigh Dearing [employer], RN-BC Phone number: [phone]

## 2019-10-27 NOTE — Plan of Care (Signed)
  Problem: Pain Managment: Goal: General experience of comfort will improve Outcome: Progressing   

## 2019-10-27 NOTE — Plan of Care (Signed)
  Problem: Pain Managment: Goal: General experience of comfort will improve 10/27/2019 0353 by Kavin Leech, RN Outcome: Progressing 10/27/2019 0133 by Kavin Leech, RN Outcome: Progressing

## 2019-10-27 NOTE — Progress Notes (Signed)
Received report from ED RN. Room ready for patient. Field Staniszewski Joselita, RN 

## 2019-10-28 DIAGNOSIS — R197 Diarrhea, unspecified: Secondary | ICD-10-CM

## 2019-10-28 DIAGNOSIS — N189 Chronic kidney disease, unspecified: Secondary | ICD-10-CM

## 2019-10-28 LAB — COMPREHENSIVE METABOLIC PANEL
ALT: 18 U/L (ref 0–44)
AST: 24 U/L (ref 15–41)
Albumin: 2.7 g/dL — ABNORMAL LOW (ref 3.5–5.0)
Alkaline Phosphatase: 38 U/L (ref 38–126)
Anion gap: 9 (ref 5–15)
BUN: 9 mg/dL (ref 6–20)
CO2: 20 mmol/L — ABNORMAL LOW (ref 22–32)
Calcium: 8.3 mg/dL — ABNORMAL LOW (ref 8.9–10.3)
Chloride: 107 mmol/L (ref 98–111)
Creatinine, Ser: 0.97 mg/dL (ref 0.61–1.24)
GFR calc Af Amer: >60 mL/min (ref >60)
GFR calc non Af Amer: >60 mL/min (ref >60)
Glucose, Bld: 94 mg/dL (ref 70–99)
Potassium: 4.2 mmol/L (ref 3.5–5.1)
Sodium: 136 mmol/L (ref 135–145)
Total Bilirubin: 0.6 mg/dL (ref 0.3–1.2)
Total Protein: 5.7 g/dL — ABNORMAL LOW (ref 6.5–8.1)

## 2019-10-28 LAB — CBC
HCT: 51.5 % (ref 39.0–52.0)
Hemoglobin: 17 g/dL (ref 13.0–17.0)
MCH: 30.1 pg (ref 26.0–34.0)
MCHC: 33 g/dL (ref 30.0–36.0)
MCV: 91.3 fL (ref 80.0–100.0)
Platelets: 122 10*3/uL — ABNORMAL LOW (ref 150–400)
RBC: 5.64 MIL/uL (ref 4.22–5.81)
RDW: 14 % (ref 11.5–15.5)
WBC: 4.9 10*3/uL (ref 4.0–10.5)
nRBC: 0 % (ref 0.0–0.2)

## 2019-10-28 LAB — RAPID URINE DRUG SCREEN, HOSP PERFORMED
Amphetamines: NOT DETECTED
Barbiturates: NOT DETECTED
Benzodiazepines: NOT DETECTED
Cocaine: NOT DETECTED
Opiates: NOT DETECTED
Tetrahydrocannabinol: NOT DETECTED

## 2019-10-28 LAB — MAGNESIUM: Magnesium: 1.9 mg/dL (ref 1.7–2.4)

## 2019-10-28 MED ORDER — METRONIDAZOLE 500 MG PO TABS
500.0000 mg | ORAL_TABLET | Freq: Three times a day (TID) | ORAL | Status: DC
  Administered 2019-10-28 – 2019-10-29 (×3): 500 mg via ORAL
  Filled 2019-10-28 (×4): qty 1

## 2019-10-28 NOTE — Plan of Care (Signed)
  Problem: Education: Goal: Knowledge of General Education information will improve Description: Including pain rating scale, medication(s)/side effects and non-pharmacologic comfort measures Outcome: Progressing   Problem: Clinical Measurements: Goal: Ability to maintain clinical measurements within normal limits will improve Outcome: Progressing   Problem: Clinical Measurements: Goal: Diagnostic test results will improve Outcome: Progressing   Problem: Clinical Measurements: Goal: Ability to maintain clinical measurements within normal limits will improve Outcome: Progressing Goal: Diagnostic test results will improve Outcome: Progressing

## 2019-10-28 NOTE — Progress Notes (Signed)
PROGRESS NOTE  Allen BeneAlton L Schmelzer Jr. 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  Also had an episode of emesis.  Had a screening colonoscopy on 09/09/2019 that revealed nonbleeding internal hemorrhoid and diverticulosis but no other finding.  No recent antibiotics or unusual food.  No family history of colon cancer or IBD.  In ED, T-max 100.3.  Tachycardic, tachypneic and slightly hypertensive.  Hgb 22.  Platelet 144.  Creatinine 1.86 (baseline 0.9-1.0).  BUN 16.  LA 2.0> 1.6.  BNP 112.  CXR negative.  EKG sinus tachycardia with P pulmonale.  CT abdomen and pelvis concerning for enterocolitis and cystitis.  UA with moderate Hgb but no other significant finding.  COVID-19 negative.  Admitted for enterocolitis, diarrhea, AKI and possible cystitis.  Urine and blood cultures sent.  Started on IV fluids, Cipro and Flagyl.   Blood cultures and C. difficile negative.  GIP pending.  Subjective: No major events overnight of this morning.  Reports about 3-4 greenish watery bowel movements overnight.  Abdominal pain improved.  He rates his pain 2/10 today.  No further nausea or vomiting.  Objective: Vitals:   10/27/19 0834 10/27/19 1701 10/27/19 2103 10/28/19 0500  BP: (!) 142/85 (!) 146/80 129/85 127/80  Pulse: (!) 109 93 91 89  Resp: 18 18 18 18   Temp: 98 F (36.7 C) 98 F (36.7 C) 99 F (37.2 C) 98.2 F (36.8 C)  TempSrc: Oral Oral Oral Oral  SpO2: 100% 100% 100% 100%  Weight:   90.4 kg   Height:        Intake/Output Summary (Last 24 hours) at 10/28/2019 1440 Last data filed at 10/28/2019 0830 Gross per 24 hour  Intake 3220.02 ml  Output 0 ml  Net 3220.02 ml   Filed Weights   10/26/19 1600 10/27/19 0106 10/27/19 2103   Weight: 96.1 kg 90.4 kg 90.4 kg    Examination:  GENERAL: No acute distress.  Appears well.  HEENT: MMM.  Vision and hearing grossly intact.  NECK: Supple.  No apparent JVD.  RESP:  No IWOB. Good air movement bilaterally. CVS:  RRR. Heart sounds normal.  ABD/GI/GU: Bowel sounds present. Soft. Non tender.  MSK/EXT:  Moves extremities. No apparent deformity or edema.  SKIN: no apparent skin lesion or wound NEURO: Awake, alert and oriented appropriately.  No apparent focal neuro deficit. PSYCH: Calm. Normal affect.  Assessment & Plan: Nausea/emesis/abdominal pain/diarrhea: could be due to enterocolitis as noted on CT abdomen and pelvis.  Sepsis with AKI due to enterocolitis-suspected infectious enterocolitis/gastroenteritis: sepsis resolved. -colonoscopy in October with nonbleeding hemorrhoid and diverticulosis but no other significant finding. -C. difficile and blood culture negative. -GIP and stool chemistry pending. -Cipro 12/2-12/3>> CTX 12/3>> -Flagyl 12/2> -NS to NS-KCl at reduced rate -Advance diet to full liquid-if he tolerates to soft  Polycythemia: likely secondary from dehydration.  Never a smoker.  No history of COPD.  No diagnosis of OSA but reports snoring and daytime fatigue occasionally.  Drinks a couple of beers and a couple of shots after work.  Hgb 22> 18>17 (baseline) -IV fluid as above. -Continue monitoring -Consider sleep study  Hypokalemia: Likely due to GI loss.  Resolved. -Replenish and recheck  Hyponatremia: Likely hypovolemic from GI loss.  Resolved. -IV fluid  as above -Continue monitoring  AKI: Resolved.  Likely prerenal from GI loss.  Not on nephrotoxic meds.  Resolved. -IV fluid as above  Elevated BNP: Likely due to slow renal clearance in the setting of AKI.  Has no cardiopulmonary symptoms. -No further work-up warranted  Lactic acidosis: Likely due to dehydration.  Resolved with hydration.  Thrombocytopenia: Dilutional?  Too early for HIT.   Improving. -Continue monitoring  History of DVT/PE-no longer on anticoagulation.  Recent COVID-19 exposure: Reportedly girlfriend was sent home from work last week due to possible exposure -COVID-19 negative here  Alcohol use disorder: reports drinking 2 beers and 2 shots on daily basis after work.  Denies withdrawal symptoms. -monitor for withdrawal symptoms               DVT prophylaxis: Subcu Lovenox Code Status: Full code Family Communication: Patient and/or RN. Available if any question.  Disposition Plan: Remains inpatient.  Still with significant diarrhea Consultants: None  Procedures:  None  Microbiology summarized: COVID-19 negative Blood culture negative so far Urine culture negative so far C. difficile negative GI panel pending  Sch Meds:  Scheduled Meds: . enoxaparin (LOVENOX) injection  40 mg Subcutaneous Q24H  . metroNIDAZOLE  500 mg Oral Q8H  . sodium chloride flush  3 mL Intravenous Q12H   Continuous Infusions: . 0.9 % NaCl with KCl 20 mEq / L 100 mL/hr at 10/28/19 1005  . cefTRIAXone (ROCEPHIN)  IV Stopped (10/27/19 2239)   PRN Meds:.acetaminophen **OR** acetaminophen, albuterol, HYDROcodone-acetaminophen, morphine injection, ondansetron **OR** ondansetron (ZOFRAN) IV  Antimicrobials: Anti-infectives (From admission, onward)   Start     Dose/Rate Route Frequency Ordered Stop   10/28/19 1000  metroNIDAZOLE (FLAGYL) tablet 500 mg     500 mg Oral Every 8 hours 10/28/19 0837     10/27/19 2000  cefTRIAXone (ROCEPHIN) 2 g in sodium chloride 0.9 % 100 mL IVPB     2 g 200 mL/hr over 30 Minutes Intravenous Every 24 hours 10/27/19 0904     10/27/19 0800  ciprofloxacin (CIPRO) IVPB 400 mg  Status:  Discontinued     400 mg 200 mL/hr over 60 Minutes Intravenous Every 12 hours 10/26/19 1622 10/27/19 0903   10/27/19 0200  metroNIDAZOLE (FLAGYL) IVPB 500 mg  Status:  Discontinued     500 mg 100 mL/hr over 60 Minutes Intravenous Every 8 hours 10/26/19 1622  10/28/19 0836   10/26/19 1600  ciprofloxacin (CIPRO) IVPB 400 mg     400 mg 200 mL/hr over 60 Minutes Intravenous  Once 10/26/19 1553 10/26/19 1832   10/26/19 1600  metroNIDAZOLE (FLAGYL) IVPB 500 mg     500 mg 100 mL/hr over 60 Minutes Intravenous  Once 10/26/19 1553 10/26/19 1824       I have personally reviewed the following labs and images: CBC: Recent Labs  Lab 10/26/19 1000 10/26/19 1330 10/27/19 0128 10/28/19 0420  WBC 8.1 7.2 6.0 4.9  NEUTROABS 6.9 6.6  --   --   HGB 21.9* 19.0* 17.9* 17.0  HCT 65.5* 57.2* 53.7* 51.5  MCV 90.3 90.9 90.4 91.3  PLT 167 148* 112* 122*   BMP &GFR Recent Labs  Lab 10/26/19 1000 10/27/19 0128 10/28/19 0420  NA 135 134* 136  K 4.8 3.4* 4.2  CL 95* 104 107  CO2 21* 19* 20*  GLUCOSE 161* 125* 94  BUN 16 12 9   CREATININE 1.86* 1.03 0.97  CALCIUM 9.8 7.9* 8.3*  MG  --   --  1.9  Estimated Creatinine Clearance: 100.2 mL/min (by C-G formula based on SCr of 0.97 mg/dL). Liver & Pancreas: Recent Labs  Lab 10/26/19 1000 10/28/19 0420  AST 34 24  ALT 19 18  ALKPHOS 54 38  BILITOT 1.0 0.6  PROT 8.6* 5.7*  ALBUMIN 4.2 2.7*   No results for input(s): LIPASE, AMYLASE in the last 168 hours. No results for input(s): AMMONIA in the last 168 hours. Diabetic: No results for input(s): HGBA1C in the last 72 hours. No results for input(s): GLUCAP in the last 168 hours. Cardiac Enzymes: No results for input(s): CKTOTAL, CKMB, CKMBINDEX, TROPONINI in the last 168 hours. No results for input(s): PROBNP in the last 8760 hours. Coagulation Profile: No results for input(s): INR, PROTIME in the last 168 hours. Thyroid Function Tests: No results for input(s): TSH, T4TOTAL, FREET4, T3FREE, THYROIDAB in the last 72 hours. Lipid Profile: No results for input(s): CHOL, HDL, LDLCALC, TRIG, CHOLHDL, LDLDIRECT in the last 72 hours. Anemia Panel: No results for input(s): VITAMINB12, FOLATE, FERRITIN, TIBC, IRON, RETICCTPCT in the last 72 hours.  Urine analysis:    Component Value Date/Time   COLORURINE YELLOW 10/26/2019 0835   APPEARANCEUR HAZY (A) 10/26/2019 0835   LABSPEC 1.021 10/26/2019 0835   PHURINE 5.0 10/26/2019 0835   GLUCOSEU NEGATIVE 10/26/2019 0835   HGBUR MODERATE (A) 10/26/2019 0835   BILIRUBINUR NEGATIVE 10/26/2019 0835   BILIRUBINUR Negative 08/12/2019 1351   KETONESUR NEGATIVE 10/26/2019 0835   PROTEINUR 30 (A) 10/26/2019 0835   UROBILINOGEN 0.2 08/12/2019 1351   UROBILINOGEN 0.2 07/29/2015 0814   NITRITE NEGATIVE 10/26/2019 0835   LEUKOCYTESUR NEGATIVE 10/26/2019 0835   Sepsis Labs: Invalid input(s): PROCALCITONIN, LACTICIDVEN  Microbiology: Recent Results (from the past 240 hour(s))  SARS CORONAVIRUS 2 (TAT 6-24 HRS) Nasopharyngeal Nasopharyngeal Swab     Status: None   Collection Time: 10/26/19  1:55 PM   Specimen: Nasopharyngeal Swab  Result Value Ref Range Status   SARS Coronavirus 2 NEGATIVE NEGATIVE Final    Comment: (NOTE) SARS-CoV-2 target nucleic acids are NOT DETECTED. The SARS-CoV-2 RNA is generally detectable in upper and lower respiratory specimens during the acute phase of infection. Negative results do not preclude SARS-CoV-2 infection, do not rule out co-infections with other pathogens, and should not be used as the sole basis for treatment or other patient management decisions. Negative results must be combined with clinical observations, patient history, and epidemiological information. The expected result is Negative. Fact Sheet for Patients: HairSlick.no Fact Sheet for Healthcare Providers: quierodirigir.com This test is not yet approved or cleared by the [place] FDA and  has been authorized for detection and/or diagnosis of SARS-CoV-2 by FDA under an Emergency Use Authorization (EUA). This EUA will remain  in effect (meaning this test can be used) for the duration of the COVID-19 declaration under Section 56  4(b)(1) of the Act, 21 U.S.C. section 360bbb-3(b)(1), unless the authorization is terminated or revoked sooner. Performed at [facility], [address] [place], [place] [address]   Culture, blood (routine x 2)     Status: None (Preliminary result)   Collection Time: 10/26/19  4:55 PM   Specimen: BLOOD  Result Value Ref Range Status   Specimen Description BLOOD RIGHT ANTECUBITAL  Final   Special Requests   Final    BOTTLES DRAWN AEROBIC AND ANAEROBIC Blood Culture results may not be optimal due to an inadequate volume of blood received in culture bottles   Culture   Final    NO GROWTH 2  DAYS Performed at [facility], [place] [address], [place], [place] [address]    Report Status PENDING  Incomplete  Urine culture     Status: None   Collection Time: 10/26/19  5:27 PM   Specimen: Urine, Random  Result Value Ref Range Status   Specimen Description URINE, RANDOM  Final   Special Requests NONE  Final   Culture   Final    NO GROWTH Performed at [facility], [place] [address], [place], [place] [id number]    Report Status 10/27/2019 FINAL  Final  Culture, blood (routine x 2)     Status: None (Preliminary result)   Collection Time: 10/27/19  1:28 AM   Specimen: BLOOD  Result Value Ref Range Status   Specimen Description BLOOD LEFT ANTECUBITAL  Final   Special Requests   Final    BOTTLES DRAWN AEROBIC AND ANAEROBIC Blood Culture results may not be optimal due to an inadequate volume of blood received in culture bottles   Culture   Final    NO GROWTH 1 DAY Performed at [facility], [place] [address], [place], [place] [address]    Report Status PENDING  Incomplete  C difficile quick scan w PCR reflex     Status: None   Collection Time: 10/27/19  9:28 AM   Specimen: STOOL  Result Value Ref Range Status   C Diff antigen NEGATIVE NEGATIVE Final   C Diff toxin NEGATIVE NEGATIVE Final   C Diff interpretation No C. difficile detected.  Final    Comment: Performed at  [facility], [place] [address] [place], [place] [address]    Radiology Studies: No results found.  Shakiyah Cirilo T. Milton  If 7PM-7AM, please contact night-coverage [email or web address] Password [email or web address] 10/28/2019, 2:40 PM

## 2019-10-29 DIAGNOSIS — F1010 Alcohol abuse, uncomplicated: Secondary | ICD-10-CM

## 2019-10-29 LAB — BASIC METABOLIC PANEL
Anion gap: 10 (ref 5–15)
BUN: 6 mg/dL (ref 6–20)
CO2: 18 mmol/L — ABNORMAL LOW (ref 22–32)
Calcium: 8.4 mg/dL — ABNORMAL LOW (ref 8.9–10.3)
Chloride: 109 mmol/L (ref 98–111)
Creatinine, Ser: 0.84 mg/dL (ref 0.61–1.24)
GFR calc Af Amer: >60 mL/min (ref >60)
GFR calc non Af Amer: >60 mL/min (ref >60)
Glucose, Bld: 91 mg/dL (ref 70–99)
Potassium: 4.3 mmol/L (ref 3.5–5.1)
Sodium: 137 mmol/L (ref 135–145)

## 2019-10-29 LAB — CBC
HCT: 50.7 % (ref 39.0–52.0)
Hemoglobin: 17 g/dL (ref 13.0–17.0)
MCH: 30.2 pg (ref 26.0–34.0)
MCHC: 33.5 g/dL (ref 30.0–36.0)
MCV: 90.1 fL (ref 80.0–100.0)
Platelets: 125 10*3/uL — ABNORMAL LOW (ref 150–400)
RBC: 5.63 MIL/uL (ref 4.22–5.81)
RDW: 13.9 % (ref 11.5–15.5)
WBC: 5.2 10*3/uL (ref 4.0–10.5)
nRBC: 0 % (ref 0.0–0.2)

## 2019-10-29 LAB — MAGNESIUM: Magnesium: 1.8 mg/dL (ref 1.7–2.4)

## 2019-10-29 MED ORDER — AMOXICILLIN-POT CLAVULANATE 875-125 MG PO TABS: 1.0000 | ORAL_TABLET | Freq: Two times a day (BID) | ORAL | 0 refills | Status: DC

## 2019-10-29 MED ORDER — SACCHAROMYCES BOULARDII 250 MG PO CAPS
250.0000 mg | ORAL_CAPSULE | Freq: Two times a day (BID) | ORAL | Status: DC
  Administered 2019-10-29: 250 mg via ORAL
  Filled 2019-10-29: qty 1

## 2019-10-29 MED ORDER — AMOXICILLIN-POT CLAVULANATE 875-125 MG PO TABS
1.0000 | ORAL_TABLET | Freq: Two times a day (BID) | ORAL | Status: DC
  Administered 2019-10-29: 1 via ORAL
  Filled 2019-10-29: qty 1

## 2019-10-29 MED ORDER — SACCHAROMYCES BOULARDII 250 MG PO CAPS: 250.0000 mg | ORAL_CAPSULE | Freq: Two times a day (BID) | ORAL | 0 refills | Status: DC

## 2019-10-29 NOTE — Discharge Summary (Signed)
Physician Discharge Summary  Allen Savage. EAV:[id number] DOB: [birth year] DOA: 10/26/2019  PCP: Ardith Dark, MD  Admit date: 10/26/2019 Discharge date: 10/29/2019  Admitted From: Home Disposition: Home  Recommendations for Outpatient Follow-up:  1. Follow ups as below. 2. Please obtain CBC/BMP/Mag at follow up 3. Please follow up on the following pending results: GI panel and stool chemistry  Home Health: None Equipment/Devices: None  Discharge Condition: Stable CODE STATUS: Full code  Follow-up Information    Ardith Dark, MD. Schedule an appointment as soon as possible for a visit in 1 week(s).   Specialty: Family Medicine Contact information: [address] [place] [address] [phone]           Hospital Course: 53 year old male with history of DVT/PE no longer on anticoagulation presented 10/26/2019 with 2 days of nausea, abdominal pain, watery diarrhea, fever and poor appetite.  Also had an episode of emesis.  Had a screening colonoscopy on 09/09/2019 that revealed nonbleeding internal hemorrhoid and diverticulosis but no other finding.  No recent antibiotics or unusual food.  No family history of colon cancer or IBD.  In ED, T-max 100.3.  Tachycardic, tachypneic and slightly hypertensive.  Hgb 22.  Platelet 144.  Creatinine 1.86 (baseline 0.9-1.0).  BUN 16.  LA 2.0> 1.6.  BNP 112.  CXR negative.  EKG sinus tachycardia with P pulmonale.  CT abdomen and pelvis concerning for enterocolitis and cystitis.  UA with moderate Hgb but no other significant finding.  COVID-19 negative.  Admitted for enterocolitis, diarrhea, AKI and possible cystitis.  Urine and blood cultures sent.  Started on IV fluids, Cipro and Flagyl.   Blood cultures and C. difficile negative.  Switch to IV ceftriaxone and Flagyl the next day.  Continued on IV fluid.  GI symptoms, AKI, electrolytes treatment and polycythemia resolved.    On the day of discharge, nausea and abdominal pain  resolved.  Bowel frequency and consistency improved.  Had only 2 bowel movements overnight.  Tolerated soft diet.  Felt well and ready to go home.  He was discharged on Augmentin for 8 more days.  Gave Rx for [employer] as well.  GIP panel stool chemistry pending at time of discharge.  See individual problem list below for more on hospital course.  Discharge Diagnoses:  Nausea/emesis/abdominal pain/diarrhea: could be due to enterocolitis as noted on CT abdomen and pelvis.  Sepsis with AKI due to enterocolitis-suspected infectious enterocolitis/gastroenteritis: sepsis, AKI and GI symptoms resolved except for mild diarrhea that has improved tremendously. -colonoscopy in October with nonbleeding hemorrhoid and diverticulosis but no other significant finding. -C. difficile and blood culture negative. -Cipro 12/2-12/3>> CTX 12/3>> 12/5 -Flagyl 12/2> 12/5 -Augmentin 12/5-12/12.  Gave Rx for [employer]. -GI panel and stool chemistry pending.  Polycythemia: likely secondary from dehydration.  Never a smoker.  No history of COPD.  No diagnosis of OSA but reports snoring and daytime fatigue occasionally.  Drinks a couple of beers and a couple of shots after work.  Hgb 22> 18>17 (baseline) -Consider sleep study  Hypokalemia/hyponatremia/AKI/lactic acidosis: Resolved.  Elevated BNP: Likely due to delayed renal clearance in the setting of AKI.  Has no cardiopulmonary symptoms.  Thrombocytopenia: Baseline low side of normal.  ITP? -Platelets 148 (admit)> 112> 125 -Recheck CBC at follow-up.  May benefit from hematology evaluation given associated polycythemia.  History of DVT/PE-no longer on anticoagulation.  Recent COVID-19 exposure: Reportedly girlfriend was sent home from work last week due to possible exposure -COVID-19 negative here  Alcohol use  disorder: reports drinking 2 beers and 2 shots on daily basis after work.  Denies withdrawal history. -Encouraged moderation   Discharge  Instructions  Discharge Instructions    Call MD for:   Complete by: As directed    Persistent diarrhea   Call MD for:  difficulty breathing, headache or visual disturbances   Complete by: As directed    Call MD for:  extreme fatigue   Complete by: As directed    Call MD for:  persistant dizziness or light-headedness   Complete by: As directed    Call MD for:  persistant nausea and vomiting   Complete by: As directed    Call MD for:  temperature >100.4   Complete by: As directed    Diet general   Complete by: As directed    Discharge instructions   Complete by: As directed    It has been a pleasure taking care of you! You were hospitalized with nausea, abdominal pain, diarrhea, fever and dehydration thought to be due to infection of your bowel.  It is still unclear what caused the infection based on the test is we have done so far although some of the test is have not resulted yet.  Your symptoms improved with intravenous fluids and antibiotics to the point we think it is safe to let you go home and follow-up with your primary care doctor.  You also had a kidney injury likely from dehydration/diarrhea.  Your kidney has recovered fully.  We are discharging you more antibiotics to complete the treatment course. Please review your new medication list and the directions before you take your medications.   Take care,   Increase activity slowly   Complete by: As directed      Allergies as of 10/29/2019   No Known Allergies     Medication List    TAKE these medications   amitriptyline 50 MG tablet Commonly known as: ELAVIL TAKE 1 TABLET(50 MG) BY MOUTH AT BEDTIME   amoxicillin-clavulanate 875-125 MG tablet Commonly known as: AUGMENTIN Take 1 tablet by mouth every 12 (twelve) hours.   saccharomyces boulardii 250 MG capsule Commonly known as: FLORASTOR Take 1 capsule (250 mg total) by mouth 2 (two) times daily.       Consultations:  None  Procedures/Studies:  2D Echo on  none   Ct Abdomen Pelvis Wo Contrast  Result Date: 10/26/2019 CLINICAL DATA:  Abdominal pain, diarrhea EXAM: CT ABDOMEN AND PELVIS WITHOUT CONTRAST TECHNIQUE: Multidetector CT imaging of the abdomen and pelvis was performed following the standard protocol without IV contrast. COMPARISON:  08/07/2007 FINDINGS: Lower chest: Mild right basilar atelectasis. No acute findings within the visualized lung bases. Hepatobiliary: No focal liver abnormality is seen. No gallstones, gallbladder wall thickening, or biliary dilatation. Pancreas: Unremarkable. No pancreatic ductal dilatation or surrounding inflammatory changes. Spleen: Normal in size without focal abnormality. Adrenals/Urinary Tract: Adrenal glands are unremarkable. Kidneys are normal, without renal calculi, focal lesion, or hydronephrosis. Circumferential urinary bladder wall thickening. Bladder contents are high in attenuation. Stomach/Bowel: There is diffuse wall thickening throughout the colon. There are a few scattered colonic diverticula without focal pericolonic inflammatory changes to suggest diverticulitis. There is long segment thickening of the distal small bowel. There is no obstruction. Normal appendix is present in the right lower quadrant. Vascular/Lymphatic: No significant vascular findings are present. No enlarged abdominal or pelvic lymph nodes. Reproductive: Prostate is unremarkable. Other: Small fat containing right inguinal hernia. Small fat containing umbilical hernia. No ascites. Musculoskeletal: No acute or  significant osseous findings. IMPRESSION: 1. Circumferential urinary bladder wall thickening suggesting cystitis. High attenuation bladder contents may represent blood products or proteinaceous fluid/debris. Correlation with urinalysis is recommended. 2. Diffuse long segment thickening of the colon and distal small bowel. Findings may represent an infectious or inflammatory enterocolitis. 3. There are a few scattered colonic  diverticula without focal pericolonic inflammatory changes to suggest diverticulitis. 4. Small fat containing right inguinal hernia. Small fat containing umbilical hernia. Electronically Signed   By: Davina Poke M.D.   On: 10/26/2019 15:45   Dg Chest Portable 1 View  Result Date: 10/26/2019 CLINICAL DATA:  Shortness of breath EXAM: PORTABLE CHEST 1 VIEW COMPARISON:  July 20, 2018 FINDINGS: Lungs are clear. Heart size and pulmonary vascularity are normal. No adenopathy. No pneumothorax. No bone lesions. IMPRESSION: No edema or consolidation. Electronically Signed   By: Lowella Grip III M.D.   On: 10/26/2019 09:03       Discharge Exam: Vitals:   10/28/19 2016 10/29/19 0500  BP: (!) 142/94 125/83  Pulse: 80 77  Resp: 18 18  Temp: 98.2 F (36.8 C) 98.2 F (36.8 C)  SpO2: 100% 100%    GENERAL: No acute distress.  Appears well.  HEENT: MMM.  Vision and hearing grossly intact.  NECK: Supple.  No apparent JVD.  RESP:  No IWOB. Good air movement bilaterally. CVS:  RRR. Heart sounds normal.  ABD/GI/GU: Bowel sounds present. Soft. Non tender.  MSK/EXT:  Moves extremities. No apparent deformity or edema.  SKIN: no apparent skin lesion or wound NEURO: Awake, alert and oriented appropriately.  No apparent focal neuro deficit. PSYCH: Calm. Normal affect.   The results of significant diagnostics from this hospitalization (including imaging, microbiology, ancillary and laboratory) are listed below for reference.     Microbiology: Recent Results (from the past 240 hour(s))  SARS CORONAVIRUS 2 (TAT 6-24 HRS) Nasopharyngeal Nasopharyngeal Swab     Status: None   Collection Time: 10/26/19  1:55 PM   Specimen: Nasopharyngeal Swab  Result Value Ref Range Status   SARS Coronavirus 2 NEGATIVE NEGATIVE Final    Comment: (NOTE) SARS-CoV-2 target nucleic acids are NOT DETECTED. The SARS-CoV-2 RNA is generally detectable in upper and lower respiratory specimens during the acute phase of  infection. Negative results do not preclude SARS-CoV-2 infection, do not rule out co-infections with other pathogens, and should not be used as the sole basis for treatment or other patient management decisions. Negative results must be combined with clinical observations, patient history, and epidemiological information. The expected result is Negative. Fact Sheet for Patients: SugarRoll.be Fact Sheet for Healthcare Providers: https://www.woods-mathews.com/ This test is not yet approved or cleared by the [place] FDA and  has been authorized for detection and/or diagnosis of SARS-CoV-2 by FDA under an Emergency Use Authorization (EUA). This EUA will remain  in effect (meaning this test can be used) for the duration of the COVID-19 declaration under Section 56 4(b)(1) of the Act, 21 U.S.C. section 360bbb-3(b)(1), unless the authorization is terminated or revoked sooner. Performed at [facility], [place] [address], [place], [place] [address]   Culture, blood (routine x 2)     Status: None (Preliminary result)   Collection Time: 10/26/19  4:55 PM   Specimen: BLOOD  Result Value Ref Range Status   Specimen Description BLOOD RIGHT ANTECUBITAL  Final   Special Requests   Final    BOTTLES DRAWN AEROBIC AND ANAEROBIC Blood Culture results may not be optimal due to an inadequate  volume of blood received in culture bottles   Culture   Final    NO GROWTH 2 DAYS Performed at [facility], [address] [place], [place] [address]    Report Status PENDING  Incomplete  Urine culture     Status: None   Collection Time: 10/26/19  5:27 PM   Specimen: Urine, Random  Result Value Ref Range Status   Specimen Description URINE, RANDOM  Final   Special Requests NONE  Final   Culture   Final    NO GROWTH Performed at [facility], [address], [place], [place] [address]    Report Status 10/27/2019 FINAL  Final  Culture, blood  (routine x 2)     Status: None (Preliminary result)   Collection Time: 10/27/19  1:28 AM   Specimen: BLOOD  Result Value Ref Range Status   Specimen Description BLOOD LEFT ANTECUBITAL  Final   Special Requests   Final    BOTTLES DRAWN AEROBIC AND ANAEROBIC Blood Culture results may not be optimal due to an inadequate volume of blood received in culture bottles   Culture   Final    NO GROWTH 1 DAY Performed at [facility], [address] [place], [place] [address]    Report Status PENDING  Incomplete  C difficile quick scan w PCR reflex     Status: None   Collection Time: 10/27/19  9:28 AM   Specimen: STOOL  Result Value Ref Range Status   C Diff antigen NEGATIVE NEGATIVE Final   C Diff toxin NEGATIVE NEGATIVE Final   C Diff interpretation No C. difficile detected.  Final    Comment: Performed at [facility], [address] [place], [place] [address]     Labs: BNP (last 3 results) Recent Labs    10/26/19 1000  BNP 112.0*   Basic Metabolic Panel: Recent Labs  Lab 10/26/19 1000 10/27/19 0128 10/28/19 0420 10/29/19 0628  NA 135 134* 136 137  K 4.8 3.4* 4.2 4.3  CL 95* 104 107 109  CO2 21* 19* 20* 18*  GLUCOSE 161* 125* 94 91  BUN 16 12 9 6   CREATININE 1.86* 1.03 0.97 0.84  CALCIUM 9.8 7.9* 8.3* 8.4*  MG  --   --  1.9 1.8   Liver Function Tests: Recent Labs  Lab 10/26/19 1000 10/28/19 0420  AST 34 24  ALT 19 18  ALKPHOS 54 38  BILITOT 1.0 0.6  PROT 8.6* 5.7*  ALBUMIN 4.2 2.7*   No results for input(s): LIPASE, AMYLASE in the last 168 hours. No results for input(s): AMMONIA in the last 168 hours. CBC: Recent Labs  Lab 10/26/19 1000 10/26/19 1330 10/27/19 0128 10/28/19 0420 10/29/19 0628  WBC 8.1 7.2 6.0 4.9 5.2  NEUTROABS 6.9 6.6  --   --   --   HGB 21.9* 19.0* 17.9* 17.0 17.0  HCT 65.5* 57.2* 53.7* 51.5 50.7  MCV 90.3 90.9 90.4 91.3 90.1  PLT 167 148* 112* 122* 125*   Cardiac Enzymes: No results for input(s): CKTOTAL, CKMB,  CKMBINDEX, TROPONINI in the last 168 hours. BNP: Invalid input(s): POCBNP CBG: No results for input(s): GLUCAP in the last 168 hours. D-Dimer No results for input(s): DDIMER in the last 72 hours. Hgb A1c No results for input(s): HGBA1C in the last 72 hours. Lipid Profile No results for input(s): CHOL, HDL, LDLCALC, TRIG, CHOLHDL, LDLDIRECT in the last 72 hours. Thyroid function studies No results for input(s): TSH,  T4TOTAL, T3FREE, THYROIDAB in the last 72 hours.  Invalid input(s): FREET3 Anemia work up No results for input(s): VITAMINB12, FOLATE, FERRITIN, TIBC, IRON, RETICCTPCT in the last 72 hours. Urinalysis    Component Value Date/Time   COLORURINE YELLOW 10/26/2019 0835   APPEARANCEUR HAZY (A) 10/26/2019 0835   LABSPEC 1.021 10/26/2019 0835   PHURINE 5.0 10/26/2019 0835   GLUCOSEU NEGATIVE 10/26/2019 0835   HGBUR MODERATE (A) 10/26/2019 0835   BILIRUBINUR NEGATIVE 10/26/2019 0835   BILIRUBINUR Negative 08/12/2019 1351   KETONESUR NEGATIVE 10/26/2019 0835   PROTEINUR 30 (A) 10/26/2019 0835   UROBILINOGEN 0.2 08/12/2019 1351   UROBILINOGEN 0.2 07/29/2015 0814   NITRITE NEGATIVE 10/26/2019 0835   LEUKOCYTESUR NEGATIVE 10/26/2019 0835   Sepsis Labs Invalid input(s): PROCALCITONIN,  WBC,  LACTICIDVEN   Time coordinating discharge: 35 minutes  SIGNED:  Almon Hercules, MD  [facility] 10/29/2019, 9:52 AM  If 7PM-7AM, please contact night-coverage [email or web address] Password [email or web address]

## 2019-10-29 NOTE — Progress Notes (Signed)
DISCHARGE NOTE HOME Allen Savage. to be discharged Home per MD order. Discussed prescriptions and follow up appointments with the patient. Prescriptions given to patient; medication list explained in detail. Patient verbalized understanding.  Skin clean, dry and intact without evidence of skin break down, no evidence of skin tears noted. IV catheter discontinued intact. Site without signs and symptoms of complications. Dressing and pressure applied. Pt denies pain at the site currently. No complaints noted.  Patient free of lines, drains, and wounds.   An After Visit Summary (AVS) was printed and given to the patient. Patient escorted via wheelchair, and discharged home via private auto.  Paulla Fore, RN

## 2019-10-30 LAB — SODIUM, STOOL: Sodium, Stl: 96 mmol/L

## 2019-10-30 LAB — POTASSIUM, STOOL: Potassium, Stl: 27 mmol/L

## 2019-10-31 ENCOUNTER — Telehealth: Payer: Self-pay

## 2019-10-31 LAB — GI PATHOGEN PANEL BY PCR, STOOL
Adenovirus F 40/41: NOT DETECTED
Astrovirus: NOT DETECTED
Campylobacter by PCR: NOT DETECTED
Cryptosporidium by PCR: NOT DETECTED
Cyclospora cayetanensis: NOT DETECTED
E coli (ETEC) LT/ST: NOT DETECTED
E coli (STEC): NOT DETECTED
Entamoeba histolytica: NOT DETECTED
Enteroaggregative E coli: NOT DETECTED
Enteropathogenic E coli: NOT DETECTED
G lamblia by PCR: NOT DETECTED
Norovirus GI/GII: NOT DETECTED
Plesiomonas shigelloides: NOT DETECTED
Rotavirus A by PCR: NOT DETECTED
Salmonella by PCR: DETECTED — AB
Sapovirus: NOT DETECTED
Shigella by PCR: NOT DETECTED
Vibrio cholerae: NOT DETECTED
Vibrio: NOT DETECTED
Yersinia enterocolitica: NOT DETECTED

## 2019-10-31 LAB — CULTURE, BLOOD (ROUTINE X 2): Culture: NO GROWTH

## 2019-10-31 NOTE — Telephone Encounter (Signed)
Copied from [place] [id number]. Topic: General - Inquiry >> Oct 31, 2019  1:51 PM Alease Frame wrote: Reason for CRM: patient wanted to know if office received fmla paperwork that was faxed to office . Please advise

## 2019-11-01 LAB — CULTURE, BLOOD (ROUTINE X 2): Culture: NO GROWTH

## 2019-11-01 NOTE — Telephone Encounter (Signed)
See note

## 2019-11-01 NOTE — Telephone Encounter (Signed)
Left detailed message stating that we have not received paperwork

## 2019-11-01 NOTE — Telephone Encounter (Signed)
Patient is calling to get the status on FMLA paperwork that was sent over again from office . He states they sent it twice and received confirmation . Please advise

## 2019-11-02 NOTE — Telephone Encounter (Signed)
Forms received patient aware.

## 2019-11-11 ENCOUNTER — Ambulatory Visit (INDEPENDENT_AMBULATORY_CARE_PROVIDER_SITE_OTHER): Payer: Commercial Managed Care - PPO | Admitting: Family Medicine

## 2019-11-11 ENCOUNTER — Other Ambulatory Visit: Payer: Self-pay

## 2019-11-11 ENCOUNTER — Encounter: Payer: Self-pay | Admitting: Family Medicine

## 2019-11-11 VITALS — BP 128/74 | HR 92 | Temp 98.2°F | Ht 69.0 in | Wt 196.0 lb

## 2019-11-11 DIAGNOSIS — K529 Noninfective gastroenteritis and colitis, unspecified: Secondary | ICD-10-CM

## 2019-11-11 NOTE — Patient Instructions (Signed)
It was very nice to see you today!  We will get your FMLA paperwork filled out.  Please let me know if your symptoms do not continue to improve.  Take care, Dr Jerline Pain  Please try these tips to maintain a healthy lifestyle:   Eat at least 3 REAL meals and 1-2 snacks per day.  Aim for no more than 5 hours between eating.  If you eat breakfast, please do so within one hour of getting up.    Each meal should contain half fruits/vegetables, one quarter protein, and one quarter carbs (no bigger than a computer mouse)   Cut down on sweet beverages. This includes juice, soda, and sweet tea.     Drink at least 1 glass of water with each meal and aim for at least 8 glasses per day   Exercise at least 150 minutes every week.

## 2019-11-11 NOTE — Progress Notes (Signed)
Chief Complaint:  Allen Hartsell. is a 53 y.o. male who presents today for a TCM visit.  Assessment/Plan:  Enterocolitis Resolving.  Does not need further management at this point.  Recommended probiotics and fiber supplementation.  Discussed reasons to return to care.  Will fill out FMLA paperwork.     Subjective:  HPI:  Summary of Hospital admission: Reason for admission: Enterocolitis Date of admission: 10/26/2019 Date of discharge: 10/29/2019 Summary of Hospital course: Patient presented to the ED on 10/26/2019 with 2 days of nausea, abdominal pain, watery diarrhea, and fever.  CT scan was done which showed enterocolitis and cystitis.  Was started on IV fluids and Cipro/Flagyl.  His symptoms gradually improved and he was weaned to Augmentin at the time of discharge.  Was also given a prescription for Florastor.  Interim history outlined by problem:    Enterocolitis Patient has done well since being discharged home.  He has finished his course of Augmentin.  Still has occasional loose stools but has had near resolution of diarrhea.  He will be going back to work on 11/15/2019.  He missed his first day of work due to this illness on 10/25/2019.  He request FMLA paperwork be filled out today.  ROS: , otherwise a complete review of systems was negative.   PMH:  The following were reviewed and entered/updated in epic: Past Medical History:  Diagnosis Date  . DVT (deep venous thrombosis) (HCC)   . History of pulmonary embolus (PE) 06/2018   from DVT in right calf   Patient Active Problem List   Diagnosis Date Noted  . Colon cancer screening 08/25/2019  . Chronic anticoagulation 08/25/2019  . Dyslipidemia 08/12/2018  . Deep vein thrombosis (DVT) of distal vein of lower extremity (HCC) 07/29/2018  . Premature ejaculation 07/29/2018  . History of pulmonary embolus (PE) 06/2018   Past Surgical History:  Procedure Laterality Date  . SHOULDER SURGERY Bilateral     Family  History  Problem Relation Age of Onset  . Diabetes Father   . Colon cancer Neg Hx   . Esophageal cancer Neg Hx   . Rectal cancer Neg Hx   . Stomach cancer Neg Hx     Medications- Reconciled discharge and current medications in Epic.  No current outpatient medications on file.   No current facility-administered medications for this visit.    Allergies-reviewed and updated No Known Allergies  Social History   Socioeconomic History  . Marital status: Significant Other    Spouse name: Not on file  . Number of children: 2  . Years of education: Not on file  . Highest education level: Not on file  Occupational History  . 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 Smoking status: Never Smoker  . Smokeless tobacco: Never Used  Substance and Sexual Activity  . Alcohol use: Yes    Alcohol/week: 1.0 standard drinks    Types: 1 Glasses of wine per week  . Drug use: No  . Sexual activity: Not on file  Other Topics Concern  . Not on file  Social History Narrative  . Not on file   Social Determinants of Health   Financial Resource Strain:   . Difficulty of Paying Living Expenses: Not on file  Food Insecurity:   . Worried About Programme researcher, broadcasting/film/video in the Last Year: Not on file  . Ran Out of Food in the Last Year: Not on file  Transportation Needs:   . Lack  of Transportation (Medical): Not on file  . Lack of Transportation (Non-Medical): Not on file  Physical Activity:   . Days of Exercise per Week: Not on file  . Minutes of Exercise per Session: Not on file  Stress:   . Feeling of Stress : Not on file  Social Connections:   . Frequency of Communication with Friends and Family: Not on file  . Frequency of Social Gatherings with Friends and Family: Not on file  . Attends Religious Services: Not on file  . Active Member of Clubs or Organizations: Not on file  . Attends Archivist Meetings: Not on file  . Marital Status: Not on file        Objective:    Physical Exam: BP 128/74 (BP Location: Left Arm, Patient Position: Sitting, Cuff Size: Normal)   Pulse 92   Temp 98.2 F (36.8 C) (Temporal)   Ht 5\' 9"  (1.753 m)   Wt 196 lb (88.9 kg)   SpO2 98%   BMI 28.94 kg/m   Gen: NAD, resting comfortably CV: RRR with no murmurs appreciated Pulm: NWOB, CTAB with no crackles, wheezes, or rhonchi GI: Normal bowel sounds present. Soft, Nontender, Nondistended. MSK: No edema, cyanosis, or clubbing noted Skin: Warm, dry Neuro: Grossly normal, moves all extremities Psych: Normal affect and thought content      Allen Savage M. Jerline Pain, MD 11/11/2019 3:03 PM

## 2019-11-24 ENCOUNTER — Telehealth: Payer: Self-pay

## 2019-11-24 NOTE — Telephone Encounter (Signed)
Can you reach out to this patient for an appointment?

## 2019-11-24 NOTE — Telephone Encounter (Signed)
-----   Message from Mauri Pole, MD sent at 11/21/2019 11:23 AM EST ----- Yes, we will try to bring him in soon for a visit.  Beth, please schedule next available visit with either me or APP soon.  Thank you VN ----- Message ----- From: Irine Seal, MD Sent: 11/21/2019  10:57 AM EST To: Mauri Pole, MD  I am seeing Mr. Stickler today for a different issue, but he is reporting a 2 week history of BRB on his stools with no pain.  He was hospitalized in November with salmonella and had hemorrhoid issues.  I am assuming he has a fissure or hemorrhoid issue, but thought it best to defer examination and therapy to you.  Can you get him set up for an appointment.  You did a colonoscopy on him in October.    Thanks,  Irine Seal

## 2019-11-24 NOTE — Telephone Encounter (Signed)
Called and spoke to Allen Savage. He was wanting to get in as soon as possible with any provider in our practice. I scheduled him with Allen Savage on 12-09-2019. I advised him that he will be placed on a wait list and will call him with a sooner appointment if one becomes available.

## 2019-12-09 ENCOUNTER — Ambulatory Visit: Payer: Commercial Managed Care - PPO | Admitting: Nurse Practitioner

## 2019-12-30 ENCOUNTER — Telehealth: Payer: Self-pay

## 2019-12-30 NOTE — Telephone Encounter (Signed)
Patient will fax forms today

## 2019-12-30 NOTE — Telephone Encounter (Signed)
Patient is calling about short term paperwork that has been sent over several times. Patient would like to know whats going on with paperwork. Please call pt. Pt states that he has been waiting on this since November. Please call pt

## 2020-01-03 DIAGNOSIS — Z0279 Encounter for issue of other medical certificate: Secondary | ICD-10-CM

## 2020-03-22 ENCOUNTER — Other Ambulatory Visit: Payer: Self-pay

## 2020-03-22 ENCOUNTER — Emergency Department (HOSPITAL_COMMUNITY)
Admission: EM | Admit: 2020-03-22 | Discharge: 2020-03-22 | Disposition: A | Discharge status: Home / Self Care | Payer: Commercial Managed Care - PPO | Attending: Emergency Medicine | Admitting: Emergency Medicine

## 2020-03-22 ENCOUNTER — Encounter (HOSPITAL_COMMUNITY): Payer: Self-pay | Admitting: Emergency Medicine

## 2020-03-22 DIAGNOSIS — K644 Residual hemorrhoidal skin tags: Secondary | ICD-10-CM | POA: Insufficient documentation

## 2020-03-22 DIAGNOSIS — Z86711 Personal history of pulmonary embolism: Secondary | ICD-10-CM | POA: Diagnosis not present

## 2020-03-22 DIAGNOSIS — K6289 Other specified diseases of anus and rectum: Secondary | ICD-10-CM | POA: Diagnosis present

## 2020-03-22 DIAGNOSIS — Z86718 Personal history of other venous thrombosis and embolism: Secondary | ICD-10-CM | POA: Diagnosis not present

## 2020-03-22 MED ORDER — HYDROCORTISONE ACETATE 25 MG RE SUPP: 25.0000 mg | Freq: Two times a day (BID) | RECTAL | 0 refills | Status: DC

## 2020-03-22 NOTE — Discharge Instructions (Addendum)
Hemorrhoids    Prescription sent to your pharmacy for Anusol suppositories. This is used to help with pain. Please use as prescribed.  The mainstay of treatment and prevention of hemorrhoids is taking steps to assure regular, soft bowel movements.  Hydration: It is recommended that you drink at least eight 8 ounce glasses of water a day to stay well-hydrated.  Fiber: May use fiber supplements, such as methylcellulose (eg, Citrucel) or psyllium (eg, Metamucil).  You may also increase the amount of fiber in your diet.  Symptomatic treatments  Hydrocortisone: Hydrocortisone creams or suppositories may be used to reduce inflammation and provide pain relief.  Use these treatments for no more than 7 days at a time.  Which Hazel: Apply as needed up to 6 times per day or after each bowel movement.  This can be found as a liquid or in products such as Tucks or Preparation H pads.  Stool softeners: May use stool softeners, such as docusate (generic for Colace), to improve comfort with bowel movements.

## 2020-03-22 NOTE — ED Provider Notes (Signed)
[facility] EMERGENCY DEPARTMENT Provider Note   CSN: [id number] Arrival date & time: 03/22/20  1356     History Chief Complaint  Patient presents with  . Hemorrhoids    Allen Savage. is a 54 y.o. male past medical history of DVT and PE presents to emergency department today with chief complaint of progressively worsening rectal pain x3 days.  Patient states he has a history of internal hemorrhoid thinks that is what is causing his pain today.  He has tried multiple over-the-counter medications including suppositories, Preparation H, Tucks pads without symptom relief.  He denies any anal trauma or injury.  He has mild pain with defecation.  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  Denies fever, chills, abdominal pain, nausea, vomiting, blood in stool, urinary symptoms. He is followed by [facility].  He states his last colonoscopy was in September 2020 and showed internal hemorrhoid and diverticulosis.  History provided by patient with additional history obtained from chart review.     Past Medical History:  Diagnosis Date  . DVT (deep venous thrombosis) (HCC)   . History of pulmonary embolus (PE) 06/2018   from DVT in right calf    Patient Active Problem List   Diagnosis Date Noted  . Colon cancer screening 08/25/2019  . Chronic anticoagulation 08/25/2019  . Dyslipidemia 08/12/2018  . Deep vein thrombosis (DVT) of distal vein of lower extremity (HCC) 07/29/2018  . Premature ejaculation 07/29/2018  . History of pulmonary embolus (PE) 06/2018    Past Surgical History:  Procedure Laterality Date  . SHOULDER SURGERY Bilateral        Family History  Problem Relation Age of Onset  . Diabetes Father   . Colon cancer Neg Hx   . Esophageal cancer Neg Hx   . Rectal cancer Neg Hx   . Stomach cancer Neg Hx     Social History   Tobacco Use  . Smoking status: Never Smoker  . Smokeless tobacco: Never Used    Substance Use Topics  . Alcohol use: Yes    Alcohol/week: 1.0 standard drinks    Types: 1 Glasses of wine per week  . Drug use: No    Home Medications Prior to Admission medications   Medication Sig Start Date End Date Taking? Authorizing Provider  hydrocortisone (ANUSOL-HC) 25 MG suppository Place 1 suppository (25 mg total) rectally 2 (two) times daily. 03/22/20   Sarvesh Meddaugh, Caroleen Hamman, PA-C    Allergies    Patient has no known allergies.  Review of Systems   Review of Systems  All other systems are reviewed and are negative for acute change except as noted in the HPI.   Physical Exam Updated Vital Signs BP (!) 158/94   Pulse (!) 107   Temp 98.6 F (37 C) (Oral)   Resp 18   Ht 5\' 9"  (1.753 m)   Wt 95.3 kg   SpO2 99%   BMI 31.01 kg/m   Physical Exam Vitals and nursing note reviewed.  Constitutional:      Appearance: He is well-developed. He is not ill-appearing or toxic-appearing.  HENT:     Head: Normocephalic and atraumatic.     Nose: Nose normal.  Eyes:     General: No scleral icterus.       Right eye: No discharge.        Left eye: No discharge.     Conjunctiva/sclera: Conjunctivae normal.  Neck:     Vascular: No JVD.  Cardiovascular:     Rate and Rhythm: Normal rate and regular rhythm.     Pulses: Normal pulses.     Heart sounds: Normal heart sounds.  Pulmonary:     Effort: Pulmonary effort is normal.     Breath sounds: Normal breath sounds.  Abdominal:     General: There is no distension.  Genitourinary:    Comments: Chaperone Raquel Sarna  present for exam. Digital Rectal Exam reveals sphincter with good tone.  Large external hemorrhoid in the 4 o'clock position without thrombosis. No masses or fissures. Stool color is brown with no overt blood. No gross melena.  Musculoskeletal:        General: Normal range of motion.     Cervical back: Normal range of motion.  Skin:    General: Skin is warm and dry.  Neurological:     Mental Status: He is oriented  to person, place, and time.     GCS: GCS eye subscore is 4. GCS verbal subscore is 5. GCS motor subscore is 6.     Comments: Fluent speech, no facial droop.  Psychiatric:        Behavior: Behavior normal.     ED Results / Procedures / Treatments   Labs (all labs ordered are listed, but only abnormal results are displayed) Labs Reviewed - No data to display  EKG None  Radiology No results found.  Procedures Procedures (including critical care time)  Medications Ordered in ED Medications - No data to display  ED Course  I have reviewed the triage vital signs and the nursing notes.  Pertinent labs & imaging results that were available during my care of the patient were reviewed by me and considered in my medical decision making (see chart for details).    MDM Rules/Calculators/A&P                      Patient is afebrile, normotensive.  Noted to be tachycardic to 107 in triage.  He does appear very anxious on my exam, suspect this is likely related.  Patient is otherwise very well-appearing. Pt w/ very large external hemorrhoid on exam. I did not feel comfortable excising due to risk of bleeding and I do not see thrombus. He is established with [facility] and plans to f/u if he does not improve w/ usual hemorrhoid care. Discussed care including MiraLAX and Colace, sitz baths, donut pillow, Anusol, and Tucks pads. Patient voiced understanding and was discharged in satisfactory condition.   Portions of this note were generated with Lobbyist. Dictation errors may occur despite best attempts at proofreading.   Final Clinical Impression(s) / ED Diagnoses Final diagnoses:  External hemorrhoids    Rx / DC Orders ED Discharge Orders         Ordered    hydrocortisone (ANUSOL-HC) 25 MG suppository  2 times daily     03/22/20 1634           Sy Saintjean, Harley Hallmark, PA-C 03/22/20 1708    Lucrezia Starch, MD 03/24/20 1148

## 2020-03-22 NOTE — ED Triage Notes (Signed)
Pt reports hemorrhoid pain.

## 2020-03-29 ENCOUNTER — Ambulatory Visit: Payer: Commercial Managed Care - PPO | Admitting: Physician Assistant

## 2020-03-29 ENCOUNTER — Encounter: Payer: Self-pay | Admitting: Physician Assistant

## 2020-03-29 VITALS — BP 142/80 | HR 93 | Temp 97.8°F | Ht 69.0 in | Wt 201.0 lb

## 2020-03-29 DIAGNOSIS — K645 Perianal venous thrombosis: Secondary | ICD-10-CM | POA: Diagnosis not present

## 2020-03-29 DIAGNOSIS — K6289 Other specified diseases of anus and rectum: Secondary | ICD-10-CM

## 2020-03-29 MED ORDER — HYDROCORTISONE (PERIANAL) 2.5 % EX CREA: 1.0000 "application " | TOPICAL_CREAM | Freq: Two times a day (BID) | CUTANEOUS | 1 refills | Status: DC

## 2020-03-29 NOTE — Progress Notes (Signed)
Chief Complaint: External hemorrhoids  HPI:    Allen Savage is a 54 year old African-American male, known to Dr. Lavon Paganini, with a past medical history as listed below including DVT and PE, who presents clinic today as a referral from Dr. Tilden Fossa in the ER for external hemorrhoids.    09/09/2019 colonoscopy with Dr. Lavon Paganini with diverticulosis in the sigmoid and descending colon and nonbleeding internal hemorrhoids.  Repeat recommended in 10 years.    03/22/2020 patient seen in the ED for progressively worsening rectal pain x3 days.  That time he tried multiple over-the-counter medications including suppositories, Preparation H, Tucks pads without relief.  Rectal exam showed a large external hemorrhoid in the 4 o'clock position without thrombosis.  He was given hydrocortisone suppositories twice a day.    Today, the patient presents to clinic and tells me that his bottom does feel some better than when he was seen in the ER but it is not completely resolved.  He never picked up the suppositories that they prescribed and has been using Preparation H cream and Tucks pads which only help a little bit.  Most of the pain comes when he passes a stool and sometimes lingers afterwards.  He describes this pain as sharp while passing and dull pain after, not seeing any blood in his stool.  Bowel movements are soft.    Patient works as a Midwife.    Denies fever, chills, weight loss, change in bowel habits, abdominal pain or symptoms that awaken him from sleep.  Past Medical History:  Diagnosis Date  . DVT (deep venous thrombosis) (HCC)   . History of pulmonary embolus (PE) 06/2018   from DVT in right calf    Past Surgical History:  Procedure Laterality Date  . SHOULDER SURGERY Bilateral     No outpatient medications.  Allergies as of 03/29/2020  . (No Known Allergies)    Family History  Problem Relation Age of Onset  . Diabetes Father   . Colon cancer Neg Hx   . Esophageal  cancer Neg Hx   . Rectal cancer Neg Hx   . Stomach cancer Neg Hx     Social History   Socioeconomic History  . Marital status: Significant Other    Spouse name: Not on file  . Number of children: 2  . Years of education: Not on file  . Highest education level: Not on file  Occupational History  . 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 Smoking status: Never Smoker  . Smokeless tobacco: Never Used  Substance and Sexual Activity  . Alcohol use: Yes    Alcohol/week: 1.0 standard drinks    Types: 1 Glasses of wine per week  . Drug use: No  . Sexual activity: Not on file  Other Topics Concern  . Not on file  Social History Narrative  . Not on file   Social Determinants of Health   Financial Resource Strain:   . Difficulty of Paying Living Expenses:   Food Insecurity:   . Worried About Programme researcher, broadcasting/film/video in the Last Year:   . Barista in the Last Year:   Transportation Needs:   . Freight forwarder (Medical):   Marland Kitchen Lack of Transportation (Non-Medical):   Physical Activity:   . Days of Exercise per Week:   . Minutes of Exercise per Session:   Stress:   . Feeling of Stress :   Social Connections:   . Frequency of Communication  with Friends and Family:   . Frequency of Social Gatherings with Friends and Family:   . Attends Religious Services:   . Active Member of Clubs or Organizations:   . Attends Archivist Meetings:   Marland Kitchen Marital Status:   Intimate Partner Violence:   . Fear of Current or Ex-Partner:   . Emotionally Abused:   Marland Kitchen Physically Abused:   . Sexually Abused:     Review of Systems:    Constitutional: No weight loss, fever or chills Skin: No rash  Cardiovascular: No chest pain Respiratory: No SOB  Gastrointestinal: See HPI and otherwise negative   Physical Exam:  Vital signs: BP (!) 142/80   Pulse 93   Temp 97.8 F (36.6 C)   Ht 5\' 9"  (1.753 m)   Wt 201 lb (91.2 kg)   BMI 29.68 kg/m   Constitutional:   Pleasant AA male  appears to be in NAD, Well developed, Well nourished, alert and cooperative Respiratory: Respirations even and unlabored. Lungs clear to auscultation bilaterally.   No wheezes, crackles, or rhonchi.  Cardiovascular: Normal S1, S2. No MRG. Regular rate and rhythm. No peripheral edema, cyanosis or pallor.  Gastrointestinal:  Soft, nondistended, nontender. No rebound or guarding. Normal bowel sounds. No appreciable masses or hepatomegaly. Rectal:  External: thrombosed external hemorrhoid, ttp, no visualized fissure, though rectum obscured by hemorrhoid; Internal: increased ttp, no mass Psychiatric: Demonstrates good judgement and reason without abnormal affect or behaviors.  No recent labs or imaging.  Assessment: 1.  Thrombosed external hemorrhoid: Still present at time of exam today, per ER notes it was not thrombosed, but now it definitely is, very tender to palpation 2.  Rectal pain: From above  Plan: 1.  Prescribed Hydrocortisone ointment to be applied twice daily for the next 7 to 14 days. 2.  Encouraged the patient to do sitz bath's for 15 to 20 minutes, 2-3 times a day 3.  Reduce straining and toilet sitting time.  Keep stool soft. 4.  Patient will call our clinic if his symptoms are no better in the next week and follow-up as needed.  He is now at least 7 to 14 days out from when this presented and would likely not benefit from I&D.  If pain continues though could consider surgical referral.  Ellouise Newer, PA-C [facility] 03/29/2020, 10:39 AM  Cc: Vivi Barrack, MD

## 2020-03-29 NOTE — Progress Notes (Signed)
Reviewed and agree with documentation and assessment and plan. K. Veena Acxel Dingee , MD   

## 2020-03-29 NOTE — Patient Instructions (Signed)
If you are age 52 or older, your body mass index should be between 23-30. Your Body mass index is 29.68 kg/m. If this is out of the aforementioned range listed, please consider follow up with your Primary Care Provider.  If you are age 52 or younger, your body mass index should be between 19-25. Your Body mass index is 29.68 kg/m. If this is out of the aformentioned range listed, please consider follow up with your Primary Care Provider.   We have sent the following medications to your pharmacy for you to pick up at your convenience: Hydrocortisone cream.    How to Take a Sitz Bath A sitz bath is a warm water bath that may be used to care for your rectum, genital area, or the area between your rectum and genitals (perineum). For a sitz bath, the water only comes up to your hips and covers your buttocks. A sitz bath may done at home in a bathtub or with a portable sitz bath that fits over the toilet. Your health care provider may recommend a sitz bath to help:  Relieve pain and discomfort after delivering a baby.  Relieve pain and itching from hemorrhoids or anal fissures.  Relieve pain after certain surgeries.  Relax muscles that are sore or tight. How to take a sitz bath Take 3-4 sitz baths a day, or as many as told by your health care provider. Bathtub sitz bath To take a sitz bath in a bathtub: 1. Partially fill a bathtub with warm water. The water should be deep enough to cover your hips and buttocks when you are sitting in the tub. 2. If your health care provider told you to put medicine in the water, follow his or her instructions. 3. Sit in the water. 4. Open the tub drain a little, and leave it open during your bath. 5. Turn on the warm water again, enough to replace the water that is draining out. Keep the water running throughout your bath. This helps keep the water at the right level and the right temperature. 6. Soak in the water for 15-20 minutes, or as long as told by your  health care provider. 7. When you are done, be careful when you stand up. You may feel dizzy. 8. After the sitz bath, pat yourself dry. Do not rub your skin to dry it.  Over-the-toilet sitz bath To take a sitz bath with an over-the-toilet basin: 1. Follow the manufacturer's instructions. 2. Fill the basin with warm water. 3. If your health care provider told you to put medicine in the water, follow his or her instructions. 4. Sit on the seat. Make sure the water covers your buttocks and perineum. 5. Soak in the water for 15-20 minutes, or as long as told by your health care provider. 6. After the sitz bath, pat yourself dry. Do not rub your skin to dry it. 7. Clean and dry the basin between uses. 8. Discard the basin if it cracks, or according to the manufacturer's instructions. Contact a health care provider if:  Your symptoms get worse. Do not continue with sitz baths if your symptoms get worse.  You have new symptoms. If this happens, do not continue with sitz baths until you talk with your health care provider. Summary  A sitz bath is a warm water bath in which the water only comes up to your hips and covers your buttocks.  A sitz bath may help relieve itching, relieve pain, and relax muscles that are  sore or tight in the lower part of your body, including your genital area.  Take 3-4 sitz baths a day, or as many as told by your health care provider. Soak in the water for 15-20 minutes.  Do not continue with sitz baths if your symptoms get worse. This information is not intended to replace advice given to you by your health care provider. Make sure you discuss any questions you have with your health care provider. Document Revised: 04/11/2019 Document Reviewed: 11/12/2017 Elsevier Patient Education  2020 [employer].

## 2020-08-15 ENCOUNTER — Other Ambulatory Visit: Payer: Self-pay

## 2020-08-15 ENCOUNTER — Encounter: Payer: Self-pay | Admitting: Family Medicine

## 2020-08-15 ENCOUNTER — Ambulatory Visit (INDEPENDENT_AMBULATORY_CARE_PROVIDER_SITE_OTHER): Payer: Commercial Managed Care - PPO | Admitting: Family Medicine

## 2020-08-15 VITALS — BP 121/78 | HR 95 | Temp 97.3°F | Ht 69.0 in | Wt 205.6 lb

## 2020-08-15 DIAGNOSIS — E785 Hyperlipidemia, unspecified: Secondary | ICD-10-CM | POA: Diagnosis not present

## 2020-08-15 DIAGNOSIS — Z125 Encounter for screening for malignant neoplasm of prostate: Secondary | ICD-10-CM

## 2020-08-15 DIAGNOSIS — F524 Premature ejaculation: Secondary | ICD-10-CM | POA: Diagnosis not present

## 2020-08-15 DIAGNOSIS — Z0001 Encounter for general adult medical examination with abnormal findings: Secondary | ICD-10-CM

## 2020-08-15 DIAGNOSIS — R739 Hyperglycemia, unspecified: Secondary | ICD-10-CM | POA: Diagnosis not present

## 2020-08-15 MED ORDER — TADALAFIL 10 MG PO TABS: 10.0000 mg | ORAL_TABLET | Freq: Every day | ORAL | 0 refills | Status: DC | PRN

## 2020-08-15 NOTE — Patient Instructions (Signed)
It was very nice to see you today!  Please talk to your pharmacist about getting the Covid vaccine.  Cialis.  We will check blood work today.  I will see back in year.  Please come back to see me sooner if needed.  Take care, Dr Jerline Pain  Please try these tips to maintain a healthy lifestyle:   Eat at least 3 REAL meals and 1-2 snacks per day.  Aim for no more than 5 hours between eating.  If you eat breakfast, please do so within one hour of getting up.    Each meal should contain half fruits/vegetables, one quarter protein, and one quarter carbs (no bigger than a computer mouse)   Cut down on sweet beverages. This includes juice, soda, and sweet tea.     Drink at least 1 glass of water with each meal and aim for at least 8 glasses per day   Exercise at least 150 minutes every week.    Preventive Care 63-26 Years Old, Male Preventive care refers to lifestyle choices and visits with your health care provider that can promote health and wellness. This includes:  A yearly physical exam. This is also called an annual well check.  Regular dental and eye exams.  Immunizations.  Screening for certain conditions.  Healthy lifestyle choices, such as eating a healthy diet, getting regular exercise, not using drugs or products that contain nicotine and tobacco, and limiting alcohol use. What can I expect for my preventive care visit? Physical exam Your health care provider will check:  Height and weight. These may be used to calculate body mass index (BMI), which is a measurement that tells if you are at a healthy weight.  Heart rate and blood pressure.  Your skin for abnormal spots. Counseling Your health care provider may ask you questions about:  Alcohol, tobacco, and drug use.  Emotional well-being.  Home and relationship well-being.  Sexual activity.  Eating habits.  Work and work Statistician. What immunizations do I need?  Influenza (flu) vaccine  This  is recommended every year. Tetanus, diphtheria, and pertussis (Tdap) vaccine  You may need a Td booster every 10 years. Varicella (chickenpox) vaccine  You may need this vaccine if you have not already been vaccinated. Zoster (shingles) vaccine  You may need this after age 74. Measles, mumps, and rubella (MMR) vaccine  You may need at least one dose of MMR if you were born in 1957 or later. You may also need a second dose. Pneumococcal conjugate (PCV13) vaccine  You may need this if you have certain conditions and were not previously vaccinated. Pneumococcal polysaccharide (PPSV23) vaccine  You may need one or two doses if you smoke cigarettes or if you have certain conditions. Meningococcal conjugate (MenACWY) vaccine  You may need this if you have certain conditions. Hepatitis A vaccine  You may need this if you have certain conditions or if you travel or work in places where you may be exposed to hepatitis A. Hepatitis B vaccine  You may need this if you have certain conditions or if you travel or work in places where you may be exposed to hepatitis B. Haemophilus influenzae type b (Hib) vaccine  You may need this if you have certain risk factors. Human papillomavirus (HPV) vaccine  If recommended by your health care provider, you may need three doses over 6 months. You may receive vaccines as individual doses or as more than one vaccine together in one shot (combination vaccines). Talk with  your health care provider about the risks and benefits of combination vaccines. What tests do I need? Blood tests  Lipid and cholesterol levels. These may be checked every 5 years, or more frequently if you are over 41 years old.  Hepatitis C test.  Hepatitis B test. Screening  Lung cancer screening. You may have this screening every year starting at age 98 if you have a 30-pack-year history of smoking and currently smoke or have quit within the past 15 years.  Prostate cancer  screening. Recommendations will vary depending on your family history and other risks.  Colorectal cancer screening. All adults should have this screening starting at age 22 and continuing until age 51. Your health care provider may recommend screening at age 40 if you are at increased risk. You will have tests every 1-10 years, depending on your results and the type of screening test.  Diabetes screening. This is done by checking your blood sugar (glucose) after you have not eaten for a while (fasting). You may have this done every 1-3 years.  Sexually transmitted disease (STD) testing. Follow these instructions at home: Eating and drinking  Eat a diet that includes fresh fruits and vegetables, whole grains, lean protein, and low-fat dairy products.  Take vitamin and mineral supplements as recommended by your health care provider.  Do not drink alcohol if your health care provider tells you not to drink.  If you drink alcohol: ? Limit how much you have to 0-2 drinks a day. ? Be aware of how much alcohol is in your drink. In the [place] one drink equals one 12 oz bottle of beer (355 mL), one 5 oz glass of wine (148 mL), or one 1 oz glass of hard liquor (44 mL). Lifestyle  Take daily care of your teeth and gums.  Stay active. Exercise for at least 30 minutes on 5 or more days each week.  Do not use any products that contain nicotine or tobacco, such as cigarettes, e-cigarettes, and chewing tobacco. If you need help quitting, ask your health care provider.  If you are sexually active, practice safe sex. Use a condom or other form of protection to prevent STIs (sexually transmitted infections).  Talk with your health care provider about taking a low-dose aspirin every day starting at age 55. What's next?  Go to your health care provider once a year for a well check visit.  Ask your health care provider how often you should have your eyes and teeth checked.  Stay up to date on all  vaccines. This information is not intended to replace advice given to you by your health care provider. Make sure you discuss any questions you have with your health care provider. Document Revised: 11/04/2018 Document Reviewed: 11/04/2018 Elsevier Patient Education  2020 [employer].

## 2020-08-15 NOTE — Assessment & Plan Note (Signed)
Check A1c. 

## 2020-08-15 NOTE — Assessment & Plan Note (Signed)
Has failed SSRI and TCAs.  Will start Cialis.  Discussed potential side effects.  Consider topical anesthetic if no significant provement.  May need referral to urology.

## 2020-08-15 NOTE — Assessment & Plan Note (Signed)
Check lipids 

## 2020-08-15 NOTE — Progress Notes (Signed)
Chief Complaint:  Allen Savage. is a 54 y.o. male who presents today for his annual comprehensive physical exam.    Assessment/Plan:  Chronic Problems Addressed Today: Hyperglycemia Check A1c.   Dyslipidemia Check lipids.   Premature ejaculation Has failed SSRI and TCAs.  Will start Cialis.  Discussed potential side effects.  Consider topical anesthetic if no significant provement.  May need referral to urology.  Preventative Healthcare: Check PSA.  Up-to-date on colon cancer screening.  He will get complete vaccine at pharmacy.  Check CBC, CMET, TSH, PSA, lipid panel, A1c.  Patient Counseling(The following topics were reviewed and/or handout was given):  -Nutrition: Stressed importance of moderation in sodium/caffeine intake, saturated fat and cholesterol, caloric balance, sufficient intake of fresh fruits, vegetables, and fiber.  -Stressed the importance of regular exercise.   -Substance Abuse: Discussed cessation/primary prevention of tobacco, alcohol, or other drug use; driving or other dangerous activities under the influence; availability of treatment for abuse.   -Injury prevention: Discussed safety belts, safety helmets, smoke detector, smoking near bedding or upholstery.   -Sexuality: Discussed sexually transmitted diseases, partner selection, use of condoms, avoidance of unintended pregnancy and contraceptive alternatives.   -Dental health: Discussed importance of regular tooth brushing, flossing, and dental visits.  -Health maintenance and immunizations reviewed. Please refer to Health maintenance section.  Return to care in 1 year for next preventative visit.     Subjective:  HPI:  He has no acute complaints today.   See A/p for status of chronic conditions.  Lifestyle Diet: No diets or eating plans.  Exercise: Limited.   Depression screen PHQ 2/9 08/12/2019  Decreased Interest 0  Down, Depressed, Hopeless 0  PHQ - 2 Score 0    Health Maintenance Due    Topic Date Due  . Hepatitis C Screening  Never done  . COVID-19 Vaccine (1) Never done  . TETANUS/TDAP  Never done     ROS: Per HPI, otherwise a complete review of systems was negative.   PMH:  The following were reviewed and entered/updated in epic: Past Medical History:  Diagnosis Date  . DVT (deep venous thrombosis) (HCC)   . Hemorrhoids    exernal   . History of pulmonary embolus (PE) 06/2018   from DVT in right calf   Patient Active Problem List   Diagnosis Date Noted  . Hyperglycemia 08/15/2020  . Chronic anticoagulation 08/25/2019  . Dyslipidemia 08/12/2018  . Deep vein thrombosis (DVT) of distal vein of lower extremity (HCC) 07/29/2018  . Premature ejaculation 07/29/2018  . History of pulmonary embolus (PE) 06/2018   Past Surgical History:  Procedure Laterality Date  . SHOULDER SURGERY Bilateral     Family History  Problem Relation Age of Onset  . Diabetes Father   . Colon cancer Neg Hx   . Esophageal cancer Neg Hx   . Rectal cancer Neg Hx   . Stomach cancer Neg Hx     Medications- reviewed and updated Current Outpatient Medications  Medication Sig Dispense Refill  . tadalafil (CIALIS) 10 MG tablet Take 1 tablet (10 mg total) by mouth daily as needed for erectile dysfunction. 30 tablet 0   No current facility-administered medications for this visit.    Allergies-reviewed and updated No Known Allergies  Social History   Socioeconomic History  . Marital status: Significant Other    Spouse name: Not on file  . Number of children: 2  . Years of education: Not on file  . Highest education level: Not  on file  Occupational History  . 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 Smoking status: Never Smoker  . Smokeless tobacco: Never Used  Vaping Use  . Vaping Use: Never used  Substance and Sexual Activity  . Alcohol use: Yes    Alcohol/week: 1.0 standard drink    Types: 1 Glasses of wine per week  . Drug use: No  . Sexual activity: Not on  file  Other Topics Concern  . Not on file  Social History Narrative  . Not on file   Social Determinants of Health   Financial Resource Strain:   . Difficulty of Paying Living Expenses: Not on file  Food Insecurity:   . Worried About Programme researcher, broadcasting/film/video in the Last Year: Not on file  . Ran Out of Food in the Last Year: Not on file  Transportation Needs:   . Lack of Transportation (Medical): Not on file  . Lack of Transportation (Non-Medical): Not on file  Physical Activity:   . Days of Exercise per Week: Not on file  . Minutes of Exercise per Session: Not on file  Stress:   . Feeling of Stress : Not on file  Social Connections:   . Frequency of Communication with Friends and Family: Not on file  . Frequency of Social Gatherings with Friends and Family: Not on file  . Attends Religious Services: Not on file  . Active Member of Clubs or Organizations: Not on file  . Attends Banker Meetings: Not on file  . Marital Status: Not on file        Objective:  Physical Exam: BP 121/78   Pulse 95   Temp (!) 97.3 F (36.3 C) (Temporal)   Ht 5\' 9"  (1.753 m)   Wt 205 lb 9.6 oz (93.3 kg)   SpO2 99%   BMI 30.36 kg/m   Body mass index is 30.36 kg/m. Wt Readings from Last 3 Encounters:  08/15/20 205 lb 9.6 oz (93.3 kg)  03/29/20 201 lb (91.2 kg)  03/22/20 210 lb (95.3 kg)   Gen: NAD, resting comfortably HEENT: TMs normal bilaterally. OP clear. No thyromegaly noted.  CV: RRR with no murmurs appreciated Pulm: NWOB, CTAB with no crackles, wheezes, or rhonchi GI: Normal bowel sounds present. Soft, Nontender, Nondistended. MSK: no edema, cyanosis, or clubbing noted Skin: warm, dry Neuro: CN2-12 grossly intact. Strength 5/5 in upper and lower extremities. Reflexes symmetric and intact bilaterally.  Psych: Normal affect and thought content     Sarajean Dessert M. 03/24/20, MD 08/15/2020 11:50 AM

## 2020-08-16 LAB — COMPREHENSIVE METABOLIC PANEL
AG Ratio: 1.5 (calc) (ref 1.0–2.5)
ALT: 12 U/L (ref 9–46)
AST: 17 U/L (ref 10–35)
Albumin: 4.1 g/dL (ref 3.6–5.1)
Alkaline phosphatase (APISO): 64 U/L (ref 35–144)
BUN: 14 mg/dL (ref 7–25)
CO2: 27 mmol/L (ref 20–32)
Calcium: 9.2 mg/dL (ref 8.6–10.3)
Chloride: 106 mmol/L (ref 98–110)
Creat: 1.03 mg/dL (ref 0.70–1.33)
Globulin: 2.7 g/dL (calc) (ref 1.9–3.7)
Glucose, Bld: 124 mg/dL — ABNORMAL HIGH (ref 65–99)
Potassium: 4.4 mmol/L (ref 3.5–5.3)
Sodium: 141 mmol/L (ref 135–146)
Total Bilirubin: 0.6 mg/dL (ref 0.2–1.2)
Total Protein: 6.8 g/dL (ref 6.1–8.1)

## 2020-08-16 LAB — HEMOGLOBIN A1C
Hgb A1c MFr Bld: 5.4 % of total Hgb (ref <5.7)
Mean Plasma Glucose: 108 (calc)
eAG (mmol/L): 6 (calc)

## 2020-08-16 LAB — CBC
HCT: 55.5 % — ABNORMAL HIGH (ref 38.5–50.0)
Hemoglobin: 18.1 g/dL — ABNORMAL HIGH (ref 13.2–17.1)
MCH: 30.5 pg (ref 27.0–33.0)
MCHC: 32.6 g/dL (ref 32.0–36.0)
MCV: 93.6 fL (ref 80.0–100.0)
MPV: 10.8 fL (ref 7.5–12.5)
Platelets: 219 10*3/uL (ref 140–400)
RBC: 5.93 10*6/uL — ABNORMAL HIGH (ref 4.20–5.80)
RDW: 13.3 % (ref 11.0–15.0)
WBC: 6.2 10*3/uL (ref 3.8–10.8)

## 2020-08-16 LAB — LIPID PANEL
Cholesterol: 212 mg/dL — ABNORMAL HIGH (ref <200)
HDL: 63 mg/dL (ref >=40)
LDL Cholesterol (Calc): 130 mg/dL (calc) — ABNORMAL HIGH
Non-HDL Cholesterol (Calc): 149 mg/dL (calc) — ABNORMAL HIGH (ref <130)
Total CHOL/HDL Ratio: 3.4 (calc) (ref <5.0)
Triglycerides: 90 mg/dL (ref <150)

## 2020-08-16 LAB — PSA: PSA: 1.34 ng/mL (ref <=4.0)

## 2020-08-16 LAB — TSH: TSH: 1.02 mIU/L (ref 0.40–4.50)

## 2020-08-20 NOTE — Progress Notes (Signed)
Please let patient know that his cholesterol was borderline but everything else is NORMAL. Would like for him to keep up the good work with diet and exercise and we can recheck in a year.  Katina Degree. [name], MD 08/20/2020 9:36 PM

## 2021-04-26 ENCOUNTER — Ambulatory Visit (INDEPENDENT_AMBULATORY_CARE_PROVIDER_SITE_OTHER): Payer: Commercial Managed Care - PPO

## 2021-04-26 ENCOUNTER — Other Ambulatory Visit: Payer: Self-pay

## 2021-04-26 ENCOUNTER — Ambulatory Visit (HOSPITAL_COMMUNITY)
Admission: EM | Admit: 2021-04-26 | Discharge: 2021-04-26 | Disposition: A | Discharge status: Home / Self Care | Payer: Commercial Managed Care - PPO | Attending: Physician Assistant | Admitting: Physician Assistant

## 2021-04-26 ENCOUNTER — Encounter (HOSPITAL_COMMUNITY): Payer: Self-pay | Admitting: Emergency Medicine

## 2021-04-26 DIAGNOSIS — M25551 Pain in right hip: Secondary | ICD-10-CM

## 2021-04-26 DIAGNOSIS — M79604 Pain in right leg: Secondary | ICD-10-CM

## 2021-04-26 DIAGNOSIS — R52 Pain, unspecified: Secondary | ICD-10-CM

## 2021-04-26 MED ORDER — NAPROXEN 500 MG PO TABS: 500.0000 mg | ORAL_TABLET | Freq: Two times a day (BID) | ORAL | 0 refills | Status: DC

## 2021-04-26 NOTE — Discharge Instructions (Signed)
Go get an ultrasound to make sure you do not have a blood clot.  I am concerned for muscular etiology so have called in some anti-inflammatories.  Please take Naprosyn twice a day for up to 10 days.  While you are taking this do not take additional NSAIDs including aspirin, ibuprofen/Advil,/Motrin, naproxen/Aleve.  Use warm compresses over this area.  Follow-up with sports medicine if your symptoms do not improve.  If anything worsens please return for reevaluation.

## 2021-04-26 NOTE — ED Triage Notes (Signed)
Pt presents with right hip/groin pain xs 3 days. Denies any falls or injury.

## 2021-04-26 NOTE — ED Provider Notes (Signed)
[facility]    CSN: [id number] Arrival date & time: 04/26/21  1304      History   Chief Complaint Chief Complaint  Patient presents with  . Hip Pain    Right    HPI Allen Savage. is a 55 y.o. male.   Patient presents today with a 3-day history of right hip pain.  He denies known injury or increase in activity prior to symptom onset.  He reports pain is rated 7 on a 0-10 pain scale, localized to anterior right hip with radiation into thigh, described as aching with periodic sharp pain, worse with palpation or attempted ambulation, no alleviating factors identified.  He has tried over-the-counter ibuprofen without improvement of symptoms.  He denies previous injury or surgery on this hip.  He does have a history of DVT in right leg that led to a PE approximately 1 year ago.  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  He denies any chest pain, shortness of breath, leg swelling, erythema.  He denies history of inguinal hernia.  He is having difficulty with daily activities as result of symptoms.     Past Medical History:  Diagnosis Date  . DVT (deep venous thrombosis) (HCC)   . Hemorrhoids    exernal   . History of pulmonary embolus (PE) 06/2018   from DVT in right calf    Patient Active Problem List   Diagnosis Date Noted  . Hyperglycemia 08/15/2020  . Chronic anticoagulation 08/25/2019  . Dyslipidemia 08/12/2018  . Deep vein thrombosis (DVT) of distal vein of lower extremity (HCC) 07/29/2018  . Premature ejaculation 07/29/2018  . History of pulmonary embolus (PE) 06/2018    Past Surgical History:  Procedure Laterality Date  . SHOULDER SURGERY Bilateral        Home Medications    Prior to Admission medications   Medication Sig Start Date End Date Taking? Authorizing Provider  naproxen (NAPROSYN) 500 MG tablet Take 1 tablet (500 mg total) by mouth 2 (two) times daily. 04/26/21  Yes Honour Schwieger,  Ailanie Ruttan K, PA-C  tadalafil (CIALIS) 10 MG tablet Take 1 tablet (10 mg total) by mouth daily as needed for erectile dysfunction. 08/15/20   Ardith Dark, MD    Family History Family History  Problem Relation Age of Onset  . Diabetes Father   . Colon cancer Neg Hx   . Esophageal cancer Neg Hx   . Rectal cancer Neg Hx   . Stomach cancer Neg Hx     Social History Social History   Tobacco Use  . Smoking status: Never Smoker  . Smokeless tobacco: Never Used  Vaping Use  . Vaping Use: Never used  Substance Use Topics  . Alcohol use: Yes    Alcohol/week: 1.0 standard drink    Types: 1 Glasses of wine per week  . Drug use: No     Allergies   Patient has no known allergies.   Review of Systems Review of Systems  Constitutional: Positive for activity change. Negative for appetite change, fatigue and fever.  Respiratory: Negative for cough and shortness of breath.   Cardiovascular: Negative for chest pain, palpitations and leg swelling.  Gastrointestinal: Negative for abdominal pain, diarrhea, nausea and vomiting.  Musculoskeletal: Positive for arthralgias and gait problem. Negative for joint swelling and myalgias.  Neurological: Negative for dizziness, light-headedness, numbness and headaches.     Physical Exam Triage Vital Signs ED  Triage Vitals  Enc Vitals Group     BP 04/26/21 1336 128/82     Pulse Rate 04/26/21 1336 83     Resp 04/26/21 1336 16     Temp 04/26/21 1336 98.6 F (37 C)     Temp Source 04/26/21 1336 Oral     SpO2 04/26/21 1336 98 %     Weight --      Height --      Head Circumference --      Peak Flow --      Pain Score 04/26/21 1333 7     Pain Loc --      Pain Edu? --      Excl. in GC? --    No data found.  Updated Vital Signs BP 128/82 (BP Location: Right Arm)   Pulse 83   Temp 98.6 F (37 C) (Oral)   Resp 16   SpO2 98%   Visual Acuity Right Eye Distance:   Left Eye Distance:   Bilateral Distance:    Right Eye Near:   Left Eye  Near:    Bilateral Near:     Physical Exam Vitals reviewed. Exam conducted with a chaperone present.  Constitutional:      General: He is awake.     Appearance: Normal appearance. He is normal weight. He is not ill-appearing.     Comments: Very pleasant male appears stated age in no acute distress  HENT:     Head: Normocephalic and atraumatic.     Mouth/Throat:     Pharynx: Uvula midline. No oropharyngeal exudate or posterior oropharyngeal erythema.  Cardiovascular:     Rate and Rhythm: Normal rate and regular rhythm.     Heart sounds: Normal heart sounds. No murmur heard.   Pulmonary:     Effort: Pulmonary effort is normal.     Breath sounds: Normal breath sounds. No stridor. No wheezing, rhonchi or rales.     Comments: Clear to auscultation bilaterally Abdominal:     Palpations: Abdomen is soft.     Tenderness: There is no abdominal tenderness.     Hernia: There is no hernia in the left inguinal area or right inguinal area.  Genitourinary:    Comments: Charlotte, RN present as chaperone during exam Musculoskeletal:     Right hip: Tenderness present. No bony tenderness. Decreased range of motion. Normal strength.     Right lower leg: No edema.     Left lower leg: No edema.     Comments: Right hip: No deformity noted.  Tenderness to palpation of anterior right hip.  Decreased range of motion with flexion and abduction secondary to pain.  Strength 5/5 at right hip.  Normal gait.  Neurological:     Mental Status: He is alert.  Psychiatric:        Behavior: Behavior is cooperative.      UC Treatments / Results  Labs (all labs ordered are listed, but only abnormal results are displayed) Labs Reviewed - No data to display  EKG   Radiology DG Hip Unilat With Pelvis 2-3 Views Right  Result Date: 04/26/2021 CLINICAL DATA:  Right hip and groin pain EXAM: DG HIP (WITH OR WITHOUT PELVIS) 2-3V RIGHT COMPARISON:  None. FINDINGS: There is no evidence of hip fracture or  dislocation. There is no evidence of arthropathy or other focal bone abnormality. IMPRESSION: Negative. Electronically Signed   By: Guadlupe Spanish M.D.   On: 04/26/2021 14:52    Procedures Procedures (including critical care  time)  Medications Ordered in UC Medications - No data to display  Initial Impression / Assessment and Plan / UC Course  I have reviewed the triage vital signs and the nursing notes.  Pertinent labs & imaging results that were available during my care of the patient were reviewed by me and considered in my medical decision making (see chart for details).     X-ray showed no acute findings.  No evidence of hernia on exam.  Low suspicion for DVT, however, given patient's history will obtain venous ultrasound to rule this out as possible etiology.  Patient was started on Naprosyn twice daily with instruction not to take additional NSAIDs.  Recommended he rest, keep leg elevated, use heat for additional symptom management.  He was provided contact information for sports medicine to follow-up should symptoms persist.  Discussed alarm symptoms that warrant emergent evaluation.  Strict return precautions given to which patient expressed understanding.  Final Clinical Impressions(s) / UC Diagnoses   Final diagnoses:  Pain  Acute pain of right hip  Right leg pain     Discharge Instructions     Go get an ultrasound to make sure you do not have a blood clot.  I am concerned for muscular etiology so have called in some anti-inflammatories.  Please take Naprosyn twice a day for up to 10 days.  While you are taking this do not take additional NSAIDs including aspirin, ibuprofen/Advil,/Motrin, naproxen/Aleve.  Use warm compresses over this area.  Follow-up with sports medicine if your symptoms do not improve.  If anything worsens please return for reevaluation.    ED Prescriptions    Medication Sig Dispense Auth. Provider   naproxen (NAPROSYN) 500 MG tablet Take 1 tablet (500  mg total) by mouth 2 (two) times daily. 20 tablet Jinelle Butchko, Noberto Retort, PA-C     PDMP not reviewed this encounter.   Jeani Hawking, PA-C 04/26/21 1505

## 2021-04-29 ENCOUNTER — Ambulatory Visit (HOSPITAL_COMMUNITY): Admit: 2021-04-29 | Payer: Commercial Managed Care - PPO

## 2021-06-11 ENCOUNTER — Encounter (HOSPITAL_COMMUNITY): Payer: Self-pay | Admitting: Emergency Medicine

## 2021-06-11 ENCOUNTER — Other Ambulatory Visit: Payer: Self-pay

## 2021-06-11 ENCOUNTER — Ambulatory Visit (HOSPITAL_COMMUNITY)
Admission: EM | Admit: 2021-06-11 | Discharge: 2021-06-11 | Disposition: A | Discharge status: Home / Self Care | Payer: BLUE CROSS/BLUE SHIELD | Attending: Emergency Medicine | Admitting: Emergency Medicine

## 2021-06-11 DIAGNOSIS — Z791 Long term (current) use of non-steroidal anti-inflammatories (NSAID): Secondary | ICD-10-CM | POA: Diagnosis not present

## 2021-06-11 DIAGNOSIS — U071 COVID-19: Secondary | ICD-10-CM | POA: Insufficient documentation

## 2021-06-11 DIAGNOSIS — B349 Viral infection, unspecified: Secondary | ICD-10-CM

## 2021-06-11 LAB — POCT RAPID STREP A, ED / UC: Streptococcus, Group A Screen (Direct): NEGATIVE

## 2021-06-11 NOTE — ED Provider Notes (Signed)
[facility]    CSN: [id number] Arrival date & time: 06/11/21  1005      History   Chief Complaint Chief Complaint  Patient presents with  . Sore Throat  . Fever    HPI Allen Savage. is a 55 y.o. male.   Patient here for evaluation of sore throat, cough, congestion, and fever that started on Saturday.  Reports taking Tylenol with minimal relief.  Reports throat pain is worse with swallowing, but managing secretions at this time.  Reports similar symptoms with strep throat in the past.  Denies any recent sick contacts but recently traveled.  Denies any trauma, injury, or other precipitating event.  Denies any chest pain, shortness of breath, N/V/D, numbness, tingling, weakness, abdominal pain, or headaches.     The history is provided by the patient.  Sore Throat  Fever Associated symptoms: congestion, cough and sore throat    Past Medical History:  Diagnosis Date  . DVT (deep venous thrombosis) (HCC)   . Hemorrhoids    exernal   . History of pulmonary embolus (PE) 06/2018   from DVT in right calf    Patient Active Problem List   Diagnosis Date Noted  . Hyperglycemia 08/15/2020  . Chronic anticoagulation 08/25/2019  . Dyslipidemia 08/12/2018  . Deep vein thrombosis (DVT) of distal vein of lower extremity (HCC) 07/29/2018  . Premature ejaculation 07/29/2018  . History of pulmonary embolus (PE) 06/2018    Past Surgical History:  Procedure Laterality Date  . SHOULDER SURGERY Bilateral        Home Medications    Prior to Admission medications   Medication Sig Start Date End Date Taking? Authorizing Provider  naproxen (NAPROSYN) 500 MG tablet Take 1 tablet (500 mg total) by mouth 2 (two) times daily. 04/26/21   Raspet, Noberto Retort, PA-C  tadalafil (CIALIS) 10 MG tablet Take 1 tablet (10 mg total) by mouth daily as needed for erectile dysfunction. 08/15/20   Ardith Dark, MD    Family History Family History  Problem Relation Age of Onset  . Diabetes  Father   . Colon cancer Neg Hx   . Esophageal cancer Neg Hx   . Rectal cancer Neg Hx   . Stomach cancer Neg Hx     Social History Social History   Tobacco Use  . Smoking status: Never  . Smokeless tobacco: Never  Vaping Use  . Vaping Use: Never used  Substance Use Topics  . Alcohol use: Yes    Alcohol/week: 1.0 standard drink    Types: 1 Glasses of wine per week  . Drug use: No     Allergies   Patient has no known allergies.   Review of Systems Review of Systems  Constitutional:  Positive for fever.  HENT:  Positive for congestion, sore throat and trouble swallowing.   Respiratory:  Positive for cough.   All other systems reviewed and are negative.   Physical Exam Triage Vital Signs ED Triage Vitals  Enc Vitals Group     BP 06/11/21 1118 129/85     Pulse Rate 06/11/21 1118 (!) 104     Resp 06/11/21 1118 16     Temp 06/11/21 1118 99 F (37.2 C)     Temp Source 06/11/21 1118 Oral     SpO2 06/11/21 1118 97 %     Weight --      Height --      Head Circumference --      Peak Flow --  Pain Score 06/11/21 1116 0     Pain Loc --      Pain Edu? --      Excl. in GC? --    No data found.  Updated Vital Signs BP 129/85 (BP Location: Right Arm)   Pulse (!) 104   Temp 99 F (37.2 C) (Oral)   Resp 16   SpO2 97%   Visual Acuity Right Eye Distance:   Left Eye Distance:   Bilateral Distance:    Right Eye Near:   Left Eye Near:    Bilateral Near:     Physical Exam Vitals and nursing note reviewed.  Constitutional:      General: He is not in acute distress.    Appearance: Normal appearance. He is not ill-appearing, toxic-appearing or diaphoretic.  HENT:     Head: Normocephalic and atraumatic.     Nose: Congestion present.     Mouth/Throat:     Pharynx: Uvula midline. Pharyngeal swelling and posterior oropharyngeal erythema present. No oropharyngeal exudate or uvula swelling.     Tonsils: No tonsillar exudate or tonsillar abscesses. 1+ on the right.  1+ on the left.  Eyes:     Conjunctiva/sclera: Conjunctivae normal.  Cardiovascular:     Rate and Rhythm: Normal rate.     Pulses: Normal pulses.  Pulmonary:     Effort: Pulmonary effort is normal.  Abdominal:     General: Abdomen is flat.  Musculoskeletal:        General: Normal range of motion.     Cervical back: Normal range of motion.  Skin:    General: Skin is warm and dry.  Neurological:     General: No focal deficit present.     Mental Status: He is alert and oriented to person, place, and time.  Psychiatric:        Mood and Affect: Mood normal.     UC Treatments / Results  Labs (all labs ordered are listed, but only abnormal results are displayed) Labs Reviewed  CULTURE, GROUP A STREP ([facility])  SARS CORONAVIRUS 2 (TAT 6-24 HRS)  POCT RAPID STREP A, ED / UC    EKG   Radiology No results found.  Procedures Procedures (including critical care time)  Medications Ordered in UC Medications - No data to display  Initial Impression / Assessment and Plan / UC Course  I have reviewed the triage vital signs and the nursing notes.  Pertinent labs & imaging results that were available during my care of the patient were reviewed by me and considered in my medical decision making (see chart for details).    Assessment negative for red flags or concerns.  Rapid strep negative, throat culture pending.  COVID test pending.  This is likely a viral illness.  Discussed need to quarantine while waiting for COVID results and for at least 5 days from symptom onset if test is negative.  Discussed conservative symptom management as described in discharge instructions.  Follow up with primary care as needed.  Final Clinical Impressions(s) / UC Diagnoses   Final diagnoses:  Viral illness     Discharge Instructions      We will contact you if your COVID test is positive.  Please quarantine while you wait for the results.  If your test is negative you may resume normal  activities.  If your test is positive please continue to quarantine for at least 5 days from your symptom onset or until you are without a fever for at least  24 hours after the medications.  You can take Tylenol and/or Ibuprofen as needed for fever reduction and pain relief.   For cough: honey 1/2 to 1 teaspoon (you can dilute the honey in water or another fluid).  You can also use guaifenesin and dextromethorphan for cough. You can use a humidifier for chest congestion and cough.  If you don't have a humidifier, you can sit in the bathroom with the hot shower running.     For sore throat: try warm salt water gargles, cepacol lozenges, throat spray, warm tea or water with lemon/honey, popsicles or ice, or OTC cold relief medicine for throat discomfort.    For congestion: take a daily anti-histamine like Zyrtec, Claritin, and a oral decongestant, such as pseudoephedrine.  You can also use Flonase 1-2 sprays in each nostril daily.    It is important to stay hydrated: drink plenty of fluids (water, gatorade/powerade/pedialyte, juices, or teas) to keep your throat moisturized and help further relieve irritation/discomfort.   Return or go to the Emergency Department if symptoms worsen or do not improve in the next few days.      ED Prescriptions   None    PDMP not reviewed this encounter.   Ivette Loyal, NP 06/11/21 1153

## 2021-06-11 NOTE — ED Triage Notes (Signed)
Pt presents with sore throat with swallowing, fever, and chills xs 4 days. States no relief when taking tylenol.

## 2021-06-11 NOTE — Discharge Instructions (Addendum)

## 2021-06-12 LAB — SARS CORONAVIRUS 2 (TAT 6-24 HRS): SARS Coronavirus 2: POSITIVE — AB

## 2021-06-14 LAB — CULTURE, GROUP A STREP (THRC)

## 2021-06-19 ENCOUNTER — Telehealth (HOSPITAL_COMMUNITY): Payer: Self-pay

## 2021-06-19 NOTE — Telephone Encounter (Signed)
Returned patients call regarding post COVID work note.   No answer.

## 2021-08-16 ENCOUNTER — Encounter: Payer: Commercial Managed Care - PPO | Admitting: Family Medicine

## 2021-09-13 ENCOUNTER — Encounter: Payer: Self-pay | Admitting: Family Medicine

## 2021-09-30 ENCOUNTER — Encounter: Payer: Self-pay | Admitting: Family Medicine

## 2021-09-30 ENCOUNTER — Other Ambulatory Visit: Payer: Self-pay

## 2021-09-30 ENCOUNTER — Ambulatory Visit (INDEPENDENT_AMBULATORY_CARE_PROVIDER_SITE_OTHER): Payer: BLUE CROSS/BLUE SHIELD | Admitting: Family Medicine

## 2021-09-30 VITALS — BP 131/84 | HR 93 | Temp 98.0°F | Ht 69.0 in | Wt 200.2 lb

## 2021-09-30 DIAGNOSIS — Z125 Encounter for screening for malignant neoplasm of prostate: Secondary | ICD-10-CM

## 2021-09-30 DIAGNOSIS — Z6829 Body mass index (BMI) 29.0-29.9, adult: Secondary | ICD-10-CM

## 2021-09-30 DIAGNOSIS — E785 Hyperlipidemia, unspecified: Secondary | ICD-10-CM

## 2021-09-30 DIAGNOSIS — R739 Hyperglycemia, unspecified: Secondary | ICD-10-CM | POA: Diagnosis not present

## 2021-09-30 DIAGNOSIS — R399 Unspecified symptoms and signs involving the genitourinary system: Secondary | ICD-10-CM | POA: Diagnosis not present

## 2021-09-30 DIAGNOSIS — F524 Premature ejaculation: Secondary | ICD-10-CM | POA: Diagnosis not present

## 2021-09-30 DIAGNOSIS — E663 Overweight: Secondary | ICD-10-CM

## 2021-09-30 DIAGNOSIS — Z0001 Encounter for general adult medical examination with abnormal findings: Secondary | ICD-10-CM

## 2021-09-30 MED ORDER — TADALAFIL 5 MG PO TABS: 5.0000 mg | ORAL_TABLET | Freq: Every day | ORAL | 5 refills | Status: DC

## 2021-09-30 NOTE — Patient Instructions (Signed)
It was very nice to see you today!  We we will check blood work today.  Please note notes.  Sending message a few weeks ago.  This is working.  I will see back in year for your physical. Come back to see me sooner if needed.   Take care, Dr [name]  PLEASE NOTE:  If you had any lab tests please let us know if you have not heard back within a few days. You may see your results on mychart before we have a chance to review them but we will give you a call once they are reviewed by [place]. If we ordered any referrals today, please let us know if you have not heard from their office within the next week.   Please try these tips to maintain a healthy lifestyle:  Eat at least 3 REAL meals and 1-2 snacks per day.  Aim for no more than 5 hours between eating.  If you eat breakfast, please do so within one hour of getting up.   Each meal should contain half fruits/vegetables, one quarter protein, and one quarter carbs (no bigger than a computer mouse)  Cut down on sweet beverages. This includes juice, soda, and sweet tea.   Drink at least 1 glass of water with each meal and aim for at least 8 glasses per day  Exercise at least 150 minutes every week.    Preventive Care 73-62 Years Old, Male Preventive care refers to lifestyle choices and visits with your health care provider that can promote health and wellness. Preventive care visits are also called wellness exams. What can I expect for my preventive care visit? Counseling During your preventive care visit, your health care provider may ask about your: Medical history, including: Past medical problems. Family medical history. Current health, including: Emotional well-being. Home life and relationship well-being. Sexual activity. Lifestyle, including: Alcohol, nicotine or tobacco, and drug use. Access to firearms. Diet, exercise, and sleep habits. Safety issues such as seatbelt and bike helmet use. Sunscreen use. Work and work  Astronomer. Physical exam Your health care provider will check your: Height and weight. These may be used to calculate your BMI (body mass index). BMI is a measurement that tells if you are at a healthy weight. Waist circumference. This measures the distance around your waistline. This measurement also tells if you are at a healthy weight and may help predict your risk of certain diseases, such as type 2 diabetes and high blood pressure. Heart rate and blood pressure. Body temperature. Skin for abnormal spots. What immunizations do I need? Vaccines are usually given at various ages, according to a schedule. Your health care provider will recommend vaccines for you based on your age, medical history, and lifestyle or other factors, such as travel or where you work. What tests do I need? Screening Your health care provider may recommend screening tests for certain conditions. This may include: Lipid and cholesterol levels. Diabetes screening. This is done by checking your blood sugar (glucose) after you have not eaten for a while (fasting). Hepatitis B test. Hepatitis C test. HIV (human immunodeficiency virus) test. STI (sexually transmitted infection) testing, if you are at risk. Lung cancer screening. Prostate cancer screening. Colorectal cancer screening. Talk with your health care provider about your test results, treatment options, and if necessary, the need for more tests. Follow these instructions at home: Eating and drinking  Eat a diet that includes fresh fruits and vegetables, whole grains, lean protein, and low-fat dairy products.  Take vitamin and mineral supplements as recommended by your health care provider. Do not drink alcohol if your health care provider tells you not to drink. If you drink alcohol: Limit how much you have to 0-2 drinks a day. Know how much alcohol is in your drink. In the [place] one drink equals one 12 oz bottle of beer (355 mL), one 5 oz glass of wine  (148 mL), or one 1 oz glass of hard liquor (44 mL). Lifestyle Brush your teeth every morning and night with fluoride toothpaste. Floss one time each day. Exercise for at least 30 minutes 5 or more days each week. Do not use any products that contain nicotine or tobacco. These products include cigarettes, chewing tobacco, and vaping devices, such as e-cigarettes. If you need help quitting, ask your health care provider. Do not use drugs. If you are sexually active, practice safe sex. Use a condom or other form of protection to prevent STIs. Take aspirin only as told by your health care provider. Make sure that you understand how much to take and what form to take. Work with your health care provider to find out whether it is safe and beneficial for you to take aspirin daily. Find healthy ways to manage stress, such as: Meditation, yoga, or listening to music. Journaling. Talking to a trusted person. Spending time with friends and family. Minimize exposure to UV radiation to reduce your risk of skin cancer. Safety Always wear your seat belt while driving or riding in a vehicle. Do not drive: If you have been drinking alcohol. Do not ride with someone who has been drinking. When you are tired or distracted. While texting. If you have been using any mind-altering substances or drugs. Wear a helmet and other protective equipment during sports activities. If you have firearms in your house, make sure you follow all gun safety procedures. What's next? Go to your health care provider once a year for an annual wellness visit. Ask your health care provider how often you should have your eyes and teeth checked. Stay up to date on all vaccines. This information is not intended to replace advice given to you by your health care provider. Make sure you discuss any questions you have with your health care provider. Document Revised: 05/08/2021 Document Reviewed: 05/08/2021 Elsevier Patient Education   2022 [employer].

## 2021-09-30 NOTE — Assessment & Plan Note (Signed)
Discussed treatment options.  We will start Cialis 5 mg daily given his concurrent premature ejaculation.  Discussed potential side effects.  He will follow-up in a few weeks via MyChart.  We can titrate dose as needed.

## 2021-09-30 NOTE — Assessment & Plan Note (Signed)
Check A1c. 

## 2021-09-30 NOTE — Progress Notes (Signed)
Chief Complaint:  Allen Savage. is a 55 y.o. male who presents today for his annual comprehensive physical exam.    Assessment/Plan:  Chronic Problems Addressed Today: Lower urinary tract symptoms (LUTS) Discussed treatment options.  We will start Cialis 5 mg daily given his concurrent premature ejaculation.  Discussed potential side effects.  He will follow-up in a few weeks via MyChart.  We can titrate dose as needed.  Hyperglycemia Check A1c.  Dyslipidemia Check lipids.  Premature ejaculation We will start daily Cialis as above.  If continues to have issues we will refer to urology.  Right Hip Pain Reassuring exam.  Likely musculoskeletal.  Recommend heating pad and over-the-counter medications.  He will let me know if not improving.  Body mass index is 29.57 kg/m. / Overweight  BMI Metric Follow Up - 09/30/21 1503       BMI Metric Follow Up-Please document annually   BMI Metric Follow Up Education provided              Preventative Healthcare: Check Labs. UTD on colon cancer screening.   Patient Counseling(The following topics were reviewed and/or handout was given):  -Nutrition: Stressed importance of moderation in sodium/caffeine intake, saturated fat and cholesterol, caloric balance, sufficient intake of fresh fruits, vegetables, and fiber.  -Stressed the importance of regular exercise.   -Substance Abuse: Discussed cessation/primary prevention of tobacco, alcohol, or other drug use; driving or other dangerous activities under the influence; availability of treatment for abuse.   -Injury prevention: Discussed safety belts, safety helmets, smoke detector, smoking near bedding or upholstery.   -Sexuality: Discussed sexually transmitted diseases, partner selection, use of condoms, avoidance of unintended pregnancy and contraceptive alternatives.   -Dental health: Discussed importance of regular tooth brushing, flossing, and dental visits.  -Health maintenance  and immunizations reviewed. Please refer to Health maintenance section.  Return to care in 1 year for next preventative visit.     Subjective:  HPI:  He mentions having a pain in his right hip. 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 Leg rotation or stretching does not produce pain. Greater leg tightness noted on his left side   The frequency in urinating has worsened to the point where he is interested in using a medication to help manage the issue. He states that he did not start using the Cialis that was prescribed to him last year. He mentions having some minor pain in his legs with no complications.  He states that he had tested positive for covid in July after having gone to [place] for a funeral.  Lab Results  Component Value Date   LDLCALC 130 (H) 08/15/2020    Lifestyle Diet: Reasonably healthy diet, but can incorporate more fruit and vegetables, reduce carb intake Exercise: Exercises, but no particular schedule/regimen   Depression screen [facility] 2/9 09/30/2021  Decreased Interest 0  Down, Depressed, Hopeless 0  PHQ - 2 Score 0    Health Maintenance Due  Topic Date Due   COVID-19 Vaccine (1) Never done   Hepatitis C Screening  Never done   TETANUS/TDAP  Never done   Zoster Vaccines- Shingrix (1 of 2) Never done     ROS: Per HPI, otherwise a complete review of systems was negative.   PMH:  The following were reviewed and entered/updated in epic: Past Medical History:  Diagnosis Date   DVT (deep venous thrombosis) (HCC)    Hemorrhoids    exernal  History of pulmonary embolus (PE) 06/2018   from DVT in right calf   Patient Active Problem List   Diagnosis Date Noted   Lower urinary tract symptoms (LUTS) 09/30/2021   Hyperglycemia 08/15/2020   Chronic anticoagulation 08/25/2019   Dyslipidemia 08/12/2018   Deep vein thrombosis (DVT) of distal vein of lower extremity (HCC) 07/29/2018   Premature ejaculation 07/29/2018   History of  pulmonary embolus (PE) 06/2018   Past Surgical History:  Procedure Laterality Date   SHOULDER SURGERY Bilateral     Family History  Problem Relation Age of Onset   Diabetes Father    Colon cancer Neg Hx    Esophageal cancer Neg Hx    Rectal cancer Neg Hx    Stomach cancer Neg Hx     Medications- reviewed and updated Current Outpatient Medications  Medication Sig Dispense Refill   naproxen (NAPROSYN) 500 MG tablet Take 1 tablet (500 mg total) by mouth 2 (two) times daily. 20 tablet 0   tadalafil (CIALIS) 5 MG tablet Take 1 tablet (5 mg total) by mouth daily. 30 tablet 5   No current facility-administered medications for this visit.    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  Tobacco Use   Smoking status: Never   Smokeless tobacco: Never  Vaping Use   Vaping Use: Never used  Substance and Sexual Activity   Alcohol use: Yes    Alcohol/week: 1.0 standard drink    Types: 1 Glasses of wine per week   Drug use: No   Sexual activity: Not on file  Other Topics Concern   Not on file  Social History Narrative   Not on file   Social Determinants of Health   Financial Resource Strain: Not on file  Food Insecurity: Not on file  Transportation Needs: Not on file  Physical Activity: Not on file  Stress: Not on file  Social Connections: Not on file        Objective:  Physical Exam: BP 131/84   Pulse 93   Temp 98 F (36.7 C)   Ht 5\' 9"  (1.753 m)   Wt 200 lb 4 oz (90.8 kg)   SpO2 99%   BMI 29.57 kg/m   Body mass index is 29.57 kg/m. Wt Readings from Last 3 Encounters:  09/30/21 200 lb 4 oz (90.8 kg)  08/15/20 205 lb 9.6 oz (93.3 kg)  03/29/20 201 lb (91.2 kg)   Gen: NAD, resting  comfortably HEENT: TMs normal bilaterally. OP clear. No thyromegaly noted.  CV: RRR with no murmurs appreciated Pulm: NWOB, CTAB with no crackles, wheezes, or rhonchi GI: Normal bowel sounds present. Soft, Nontender, Nondistended. MSK: no edema, cyanosis, or clubbing noted.  -Right leg: No deformities.  Normal internal and external rotation.  Neurovascular intact distally Skin: warm, dry Neuro: CN2-12 grossly intact. Strength 5/5 in upper and lower extremities. Reflexes symmetric and intact bilaterally.  Psych: Normal affect and thought content     I,Jordan Kelly,acting as a scribe for 05/29/20, MD.,have documented all relevant documentation on the behalf of Jacquiline Doe, MD,as directed by  Jacquiline Doe, MD while in the presence of Jacquiline Doe, MD.  I, Jacquiline Doe, MD, have reviewed all documentation  for this visit. The documentation on 09/30/21 for the exam, diagnosis, procedures, and orders are all accurate and complete.  Katina Degree. [name], MD 09/30/2021 3:04 PM

## 2021-09-30 NOTE — Assessment & Plan Note (Signed)
Check lipids 

## 2021-09-30 NOTE — Assessment & Plan Note (Signed)
We will start daily Cialis as above.  If continues to have issues we will refer to urology.

## 2021-10-01 LAB — LIPID PANEL
Cholesterol: 199 mg/dL (ref 0–200)
HDL: 58.2 mg/dL (ref >39.00)
LDL Cholesterol: 120 mg/dL — ABNORMAL HIGH (ref 0–99)
NonHDL: 141.08
Total CHOL/HDL Ratio: 3
Triglycerides: 104 mg/dL (ref 0.0–149.0)
VLDL: 20.8 mg/dL (ref 0.0–40.0)

## 2021-10-01 LAB — CBC
HCT: 55.1 % — ABNORMAL HIGH (ref 39.0–52.0)
Hemoglobin: 17.9 g/dL — ABNORMAL HIGH (ref 13.0–17.0)
MCHC: 32.5 g/dL (ref 30.0–36.0)
MCV: 92.4 fl (ref 78.0–100.0)
Platelets: 195 10*3/uL (ref 150.0–400.0)
RBC: 5.96 Mil/uL — ABNORMAL HIGH (ref 4.22–5.81)
RDW: 15.9 % — ABNORMAL HIGH (ref 11.5–15.5)
WBC: 8.6 10*3/uL (ref 4.0–10.5)

## 2021-10-01 LAB — COMPREHENSIVE METABOLIC PANEL
ALT: 12 U/L (ref 0–53)
AST: 17 U/L (ref 0–37)
Albumin: 4 g/dL (ref 3.5–5.2)
Alkaline Phosphatase: 79 U/L (ref 39–117)
BUN: 22 mg/dL (ref 6–23)
CO2: 26 mEq/L (ref 19–32)
Calcium: 9.3 mg/dL (ref 8.4–10.5)
Chloride: 103 mEq/L (ref 96–112)
Creatinine, Ser: 1.08 mg/dL (ref 0.40–1.50)
GFR: 78.54 mL/min (ref >60.00)
Glucose, Bld: 100 mg/dL — ABNORMAL HIGH (ref 70–99)
Potassium: 4.3 mEq/L (ref 3.5–5.1)
Sodium: 138 mEq/L (ref 135–145)
Total Bilirubin: 0.8 mg/dL (ref 0.2–1.2)
Total Protein: 6.7 g/dL (ref 6.0–8.3)

## 2021-10-01 LAB — PSA: PSA: 1.44 ng/mL (ref 0.10–4.00)

## 2021-10-01 LAB — TSH: TSH: 3.41 u[IU]/mL (ref 0.35–5.50)

## 2021-10-01 LAB — HEMOGLOBIN A1C: Hgb A1c MFr Bld: 5.9 % (ref 4.6–6.5)

## 2021-10-03 NOTE — Progress Notes (Signed)
Please inform patient of the following:  His cholesterol and blood sugar are both borderline but stable. Everything else is NORMAL. Do not need to make any changes to his treatment plan at this time but he should continue working on diet and exercise and we can recheck in a year.  Katina Degree. [name], MD 10/03/2021 8:08 AM

## 2021-10-14 ENCOUNTER — Telehealth: Payer: Self-pay

## 2021-10-14 NOTE — Telephone Encounter (Signed)
Tried returning call to patient, no answer, unable to leave a message, mailbox full.

## 2021-10-14 NOTE — Telephone Encounter (Signed)
Patient calling back about lab results if he could be called back before 2pm when he goes back to work.

## 2021-10-14 NOTE — Telephone Encounter (Signed)
Lab results given to patient at 3:48 pm Richardine Service, CMA student.

## 2022-04-09 ENCOUNTER — Other Ambulatory Visit: Payer: Self-pay

## 2022-04-09 ENCOUNTER — Telehealth: Payer: Self-pay | Admitting: Family Medicine

## 2022-04-09 MED ORDER — TADALAFIL 5 MG PO TABS: 5.0000 mg | ORAL_TABLET | Freq: Every day | ORAL | 3 refills | Status: DC

## 2022-04-09 NOTE — Telephone Encounter (Signed)
Pt states he never filled this RX because the cost was too high. ? ?Pt requests an alternative script be written that costs less ? ?Please call pt with any questions. ? ? ?LAST APPOINTMENT DATE:  09/30/2021 ? ?NEXT APPOINTMENT DATE: 10/03/2022 ? ?MEDICATION: ?Alternative for: ?tadalafil (CIALIS) 5 MG tablet [[id number]]  ? ?Is the patient out of medication?  ?Yes, because he never filled the initial Rx. ? ?PHARMACY: ?[facility] STORE [phone] [place], [place] - [address] AT [facility]  ?[address] [place], [place] [address]  ?Phone:  [phone]  Fax:  [phone]  ? ?

## 2022-04-09 NOTE — Telephone Encounter (Signed)
Rx sent to pharmacy   

## 2022-04-17 ENCOUNTER — Other Ambulatory Visit: Payer: Self-pay

## 2022-04-17 ENCOUNTER — Other Ambulatory Visit: Payer: Self-pay | Admitting: *Deleted

## 2022-04-17 MED ORDER — SILDENAFIL CITRATE 20 MG PO TABS: 20.0000 mg | ORAL_TABLET | Freq: Every day | ORAL | 3 refills | Status: DC | PRN

## 2022-04-17 NOTE — Telephone Encounter (Signed)
We can send in sildenafil 20mg  tablets take 2-3 tablets daily as needed for erectile dysfunction.

## 2022-04-17 NOTE — Addendum Note (Signed)
Addended by: Ardith Dark on: 04/17/2022 12:12 PM   Modules accepted: Orders

## 2022-04-17 NOTE — Telephone Encounter (Signed)
When trying to order Rx only comes up as 50 or 100mg  please advise?

## 2022-04-17 NOTE — Telephone Encounter (Signed)
Please advise 

## 2022-04-17 NOTE — Telephone Encounter (Addendum)
Pt states the Cialis was called in and he cannot afford it. He was wanting to change his medication to something else he can afford. He is out of his current medication. Please advise

## 2022-08-14 ENCOUNTER — Encounter (HOSPITAL_COMMUNITY): Payer: Self-pay | Admitting: Emergency Medicine

## 2022-08-14 ENCOUNTER — Emergency Department (HOSPITAL_COMMUNITY)
Admission: EM | Admit: 2022-08-14 | Discharge: 2022-08-15 | Disposition: A | Discharge status: Home / Self Care | Payer: Commercial Managed Care - PPO | Attending: Emergency Medicine | Admitting: Emergency Medicine

## 2022-08-14 ENCOUNTER — Other Ambulatory Visit: Payer: Self-pay

## 2022-08-14 DIAGNOSIS — N3289 Other specified disorders of bladder: Secondary | ICD-10-CM | POA: Insufficient documentation

## 2022-08-14 DIAGNOSIS — R109 Unspecified abdominal pain: Secondary | ICD-10-CM | POA: Diagnosis present

## 2022-08-14 NOTE — ED Triage Notes (Signed)
Pt reported to ED with c/o lower back pain x 2-3 days. Pt endorses urine that is darker in color than normal and frequent urination with burning.

## 2022-08-15 ENCOUNTER — Emergency Department (HOSPITAL_COMMUNITY): Payer: Commercial Managed Care - PPO

## 2022-08-15 LAB — COMPREHENSIVE METABOLIC PANEL
ALT: 18 U/L (ref 0–44)
AST: 23 U/L (ref 15–41)
Albumin: 3.5 g/dL (ref 3.5–5.0)
Alkaline Phosphatase: 68 U/L (ref 38–126)
Anion gap: 10 (ref 5–15)
BUN: 6 mg/dL (ref 6–20)
CO2: 26 mmol/L (ref 22–32)
Calcium: 9.2 mg/dL (ref 8.9–10.3)
Chloride: 102 mmol/L (ref 98–111)
Creatinine, Ser: 0.85 mg/dL (ref 0.61–1.24)
GFR, Estimated: >60 mL/min (ref >60)
Glucose, Bld: 115 mg/dL — ABNORMAL HIGH (ref 70–99)
Potassium: 4.1 mmol/L (ref 3.5–5.1)
Sodium: 138 mmol/L (ref 135–145)
Total Bilirubin: 0.8 mg/dL (ref 0.3–1.2)
Total Protein: 6.6 g/dL (ref 6.5–8.1)

## 2022-08-15 LAB — CBC WITH DIFFERENTIAL/PLATELET
Abs Immature Granulocytes: 0.02 10*3/uL (ref 0.00–0.07)
Basophils Absolute: 0 10*3/uL (ref 0.0–0.1)
Basophils Relative: 0 %
Eosinophils Absolute: 0.1 10*3/uL (ref 0.0–0.5)
Eosinophils Relative: 1 %
HCT: 55.5 % — ABNORMAL HIGH (ref 39.0–52.0)
Hemoglobin: 18.9 g/dL — ABNORMAL HIGH (ref 13.0–17.0)
Immature Granulocytes: 0 %
Lymphocytes Relative: 24 %
Lymphs Abs: 1.7 10*3/uL (ref 0.7–4.0)
MCH: 32.6 pg (ref 26.0–34.0)
MCHC: 34.1 g/dL (ref 30.0–36.0)
MCV: 95.7 fL (ref 80.0–100.0)
Monocytes Absolute: 0.8 10*3/uL (ref 0.1–1.0)
Monocytes Relative: 11 %
Neutro Abs: 4.6 10*3/uL (ref 1.7–7.7)
Neutrophils Relative %: 64 %
Platelets: 161 10*3/uL (ref 150–400)
RBC: 5.8 MIL/uL (ref 4.22–5.81)
RDW: 14.5 % (ref 11.5–15.5)
WBC: 7.2 10*3/uL (ref 4.0–10.5)
nRBC: 0 % (ref 0.0–0.2)

## 2022-08-15 LAB — URINALYSIS, ROUTINE W REFLEX MICROSCOPIC
Bacteria, UA: NONE SEEN
Bilirubin Urine: NEGATIVE
Glucose, UA: NEGATIVE mg/dL
Hgb urine dipstick: NEGATIVE
Ketones, ur: NEGATIVE mg/dL
Leukocytes,Ua: NEGATIVE
Nitrite: NEGATIVE
Protein, ur: NEGATIVE mg/dL
Specific Gravity, Urine: 1.006 (ref 1.005–1.030)
pH: 5 (ref 5.0–8.0)

## 2022-08-15 MED ORDER — OXYCODONE HCL 5 MG PO TABS
10.0000 mg | ORAL_TABLET | Freq: Once | ORAL | Status: AC
  Administered 2022-08-15: 10 mg via ORAL
  Filled 2022-08-15: qty 2

## 2022-08-15 MED ORDER — OXYCODONE-ACETAMINOPHEN 5-325 MG PO TABS
1.0000 | ORAL_TABLET | Freq: Once | ORAL | Status: AC
  Administered 2022-08-15: 1 via ORAL
  Filled 2022-08-15: qty 1

## 2022-08-15 MED ORDER — IOHEXOL 350 MG/ML SOLN
80.0000 mL | Freq: Once | INTRAVENOUS | Status: AC | PRN
  Administered 2022-08-15: 80 mL via INTRAVENOUS

## 2022-08-15 NOTE — ED Notes (Signed)
CT aware that pt now has PIV

## 2022-08-15 NOTE — ED Provider Notes (Signed)
[facility] EMERGENCY DEPARTMENT Provider Note   CSN: [id number] Arrival date & time: 08/14/22  2327     History  Chief Complaint  Patient presents with   Back Pain    Allen Savage. is a 56 y.o. male.  56 year old male who presents the ER today with left flank pain and abdominal distention.  Patient states has been progressively worsening over the last week or so.  Also noticed some dark urine but that seems to have improved.  He states he does drink alcohol at least a few drinks every day consisting of beers or shots or both.  Rarely gets inebriated.  Has been doing this for years.  After noticing some mild scleral icterus it sounds like from him and his wife that this is actually his baseline and is not abnormal for him.  No fevers, IV drug use.  No real abdominal pain.   Back Pain      Home Medications Prior to Admission medications   Medication Sig Start Date End Date Taking? Authorizing Provider  naproxen (NAPROSYN) 500 MG tablet Take 1 tablet (500 mg total) by mouth 2 (two) times daily. 04/26/21   Raspet, Erin K, PA-C  sildenafil (REVATIO) 20 MG tablet Take 1-3 tablets (20-60 mg total) by mouth daily as needed (erectile dysfunction). 04/17/22   Ardith Dark, MD  tadalafil (CIALIS) 5 MG tablet Take 1 tablet (5 mg total) by mouth daily. 04/09/22   Ardith Dark, MD      Allergies    Patient has no known allergies.    Review of Systems   Review of Systems  Musculoskeletal:  Positive for back pain.    Physical Exam Updated Vital Signs BP 128/73 (BP Location: Right Arm)   Pulse 61   Temp 98.4 F (36.9 C) (Oral)   Resp 18   SpO2 98%  Physical Exam Vitals and nursing note reviewed.  Constitutional:      Appearance: He is well-developed.  HENT:     Head: Normocephalic and atraumatic.     Mouth/Throat:     Mouth: Mucous membranes are moist.  Eyes:     General: Scleral icterus present.     Pupils: Pupils are equal, round, and reactive to  light.  Cardiovascular:     Rate and Rhythm: Normal rate.  Pulmonary:     Effort: Pulmonary effort is normal. No respiratory distress.  Abdominal:     General: Abdomen is flat. There is distension (Distended without tenderness or fluid wave.).  Musculoskeletal:        General: No swelling or deformity. Normal range of motion.     Cervical back: Normal range of motion.  Skin:    General: Skin is warm and dry.  Neurological:     General: No focal deficit present.     Mental Status: He is alert.     ED Results / Procedures / Treatments   Labs (all labs ordered are listed, but only abnormal results are displayed) Labs Reviewed  CBC WITH DIFFERENTIAL/PLATELET - Abnormal; Notable for the following components:      Result Value   Hemoglobin 18.9 (*)    HCT 55.5 (*)    All other components within normal limits  COMPREHENSIVE METABOLIC PANEL - Abnormal; Notable for the following components:   Glucose, Bld 115 (*)    All other components within normal limits  URINALYSIS, ROUTINE W REFLEX MICROSCOPIC    EKG None  Radiology CT L-SPINE NO CHARGE  Result Date: 08/15/2022 CLINICAL DATA:  Initial evaluation for acute lower back pain. EXAM: CT LUMBAR SPINE WITHOUT CONTRAST TECHNIQUE: Multidetector CT imaging of the lumbar spine was performed without intravenous contrast administration. Multiplanar CT image reconstructions were also generated. RADIATION DOSE REDUCTION: This exam was performed according to the departmental dose-optimization program which includes automated exposure control, adjustment of the mA and/or kV according to patient size and/or use of iterative reconstruction technique. COMPARISON:  None Available. FINDINGS: Segmentation: Standard. Lowest well-formed disc space labeled the L5-S1 level. Alignment: Physiologic with preservation of the normal lumbar lordosis. No listhesis. Vertebrae: Vertebral body height maintained without acute or chronic fracture. Visualized sacrum and  pelvis intact. No worrisome osseous lesions. Paraspinal and other soft tissues: Unremarkable. Disc levels: No significant disc pathology seen within the lumbar spine by CT. Lower lumbar facet hypertrophy, moderate to advanced in nature at L5-S1 on the right. IMPRESSION: 1. No acute osseous abnormality about the lumbar spine. 2. Lower lumbar facet hypertrophy, moderate to advanced in nature at L5-S1 on the right. Electronically Signed   By: Rise Mu M.D.   On: 08/15/2022 03:35   CT ABDOMEN PELVIS W CONTRAST  Result Date: 08/15/2022 CLINICAL DATA:  Abdominal pain. EXAM: CT ABDOMEN AND PELVIS WITH CONTRAST TECHNIQUE: Multidetector CT imaging of the abdomen and pelvis was performed using the standard protocol following bolus administration of intravenous contrast. RADIATION DOSE REDUCTION: This exam was performed according to the departmental dose-optimization program which includes automated exposure control, adjustment of the mA and/or kV according to patient size and/or use of iterative reconstruction technique. CONTRAST:  36mL OMNIPAQUE IOHEXOL 350 MG/ML SOLN COMPARISON:  October 26, 2019 FINDINGS: Lower chest: A 4 mm noncalcified lung nodule is seen within the lingular region. This measures 2 mm on the prior study. Mild, stable posterolateral right lower lobe linear scarring and/or atelectasis is seen. Hepatobiliary: There is diffuse fatty infiltration of the liver parenchyma. No focal liver abnormality is seen. No gallstones, gallbladder wall thickening, or biliary dilatation. Pancreas: Unremarkable. No pancreatic ductal dilatation or surrounding inflammatory changes. Spleen: Normal in size without focal abnormality. Adrenals/Urinary Tract: Adrenal glands are unremarkable. Kidneys are normal, without renal calculi, focal lesion, or hydronephrosis. There is moderate severity diffuse urinary bladder wall thickening. A mild amount of surrounding inflammatory fat stranding is noted. Stomach/Bowel:  Stomach is within normal limits. Appendix appears normal. No evidence of bowel wall thickening, distention, or inflammatory changes. Vascular/Lymphatic: Aortic atherosclerosis. No enlarged abdominal or pelvic lymph nodes. Reproductive: The prostate gland is mildly enlarged. Other: No abdominal wall hernia or abnormality. No abdominopelvic ascites. Musculoskeletal: No acute or significant osseous findings. IMPRESSION: 1. Moderate severity cystitis. Correlation with urinalysis is recommended. 2. Hepatic steatosis. 3. 4 mm noncalcified lingular lung nodule, increased in size when compared to the prior study. Correlation with follow-up nonemergent chest CT is recommended to determine the presence or absence of additional noncalcified lung nodules. Aortic Atherosclerosis (ICD10-I70.0). Electronically Signed   By: Aram Candela M.D.   On: 08/15/2022 02:48    Procedures Procedures    Medications Ordered in ED Medications  oxyCODONE-acetaminophen (PERCOCET/ROXICET) 5-325 MG per tablet 1 tablet (1 tablet Oral Given 08/15/22 0007)  iohexol (OMNIPAQUE) 350 MG/ML injection 80 mL (80 mLs Intravenous Contrast Given 08/15/22 0233)  oxyCODONE (Oxy IR/ROXICODONE) immediate release tablet 10 mg (10 mg Oral Given 08/15/22 0329)    ED Course/ Medical Decision Making/ A&P  Medical Decision Making Risk Prescription drug management.   Patient's labs are reassuring.  Considered cirrhosis, hepatitis, nephrolithiasis versus malignant ascites so CT scan was done.  This was overall reassuring.  Reviewed myself did not see evidence of these findings however does have a pretty thickened urinary bladder.  His urine is perfectly clean, unsure of the significance of this however could be some type of urothelial cancer and probably needs a cystoscopy possible biopsy so we will refer to urology.  I do not see any evidence of any injury or other causes for his left flank pain.  Improved here is not any  distress.  No ascites.  Patient stable for discharge with PCP follow-up for his symptoms.  Final Clinical Impression(s) / ED Diagnoses Final diagnoses:  Bladder wall thickening    Rx / DC Orders ED Discharge Orders          Ordered    Ambulatory referral to Urology        08/15/22 0336              Merrily Pew, MD 08/15/22 [phone]

## 2022-09-29 ENCOUNTER — Emergency Department (HOSPITAL_COMMUNITY)
Admission: EM | Admit: 2022-09-29 | Discharge: 2022-09-29 | Disposition: A | Discharge status: Home / Self Care | Payer: Commercial Managed Care - PPO | Attending: Emergency Medicine | Admitting: Emergency Medicine

## 2022-09-29 ENCOUNTER — Emergency Department (HOSPITAL_COMMUNITY): Payer: Commercial Managed Care - PPO

## 2022-09-29 ENCOUNTER — Encounter (HOSPITAL_COMMUNITY): Payer: Self-pay | Admitting: Emergency Medicine

## 2022-09-29 DIAGNOSIS — L723 Sebaceous cyst: Secondary | ICD-10-CM | POA: Diagnosis not present

## 2022-09-29 DIAGNOSIS — R1909 Other intra-abdominal and pelvic swelling, mass and lump: Secondary | ICD-10-CM | POA: Diagnosis present

## 2022-09-29 MED ORDER — DOXYCYCLINE HYCLATE 100 MG PO CAPS: 100.0000 mg | ORAL_CAPSULE | Freq: Two times a day (BID) | ORAL | 0 refills | Status: DC

## 2022-09-29 NOTE — ED Triage Notes (Signed)
Pt here from home with c/o a bump on his testicles that started bleeding  this morning ( been there about one week ) , no problems urinating and no discharge

## 2022-09-29 NOTE — ED Provider Triage Note (Signed)
Emergency Medicine Provider Triage Evaluation Note  Allen Savage , a 56 y.o. male  was evaluated in triage.  Pt complains of bleeding masson scrotum. Felt a bump 3 days ago them got progressively larger. Began bleeding today. Pt squeezed it and blood came out. No hx of same. Does not hurt. .  Review of Systems  Positive: Scrotal mass Negative: dysuria  Physical Exam  BP (!) 152/96   Pulse 85   Temp 98.2 F (36.8 C) (Oral)   Resp 16   SpO2 100%  Gen:   Awake, no distress   Resp:  Normal effort  MSK:   Moves extremities without difficulty  Other:  Well circumscribed 2 cm mass with ulceration , mobile  Medical Decision Making  Medically screening exam initiated at 9:13 AM.  Appropriate orders placed.  Allen Savage. was informed that the remainder of the evaluation will be completed by another provider, this initial triage assessment does not replace that evaluation, and the importance of remaining in the ED until their evaluation is complete.     Margarita Mail, PA-C 09/29/22 [phone]

## 2022-09-29 NOTE — Discharge Instructions (Addendum)
Contact a health care provider if: Your cyst develops symptoms of infection. Your condition is not improving or is getting worse. You develop a cyst that looks different from other cysts you have had. You have a fever. Get help right away if: Redness spreads from the cyst into the surrounding area. 

## 2022-09-29 NOTE — ED Provider Notes (Signed)
[facility] EMERGENCY DEPARTMENT Provider Note   CSN: [id number] Arrival date & time: 09/29/22  0831     History  Chief Complaint  Patient presents with   Groin Swelling    Allen Savage. is a 56 y.o. male who presents to the ED for scrotal mass. Patient had a small mass that he noticed 3 days ago. It got a lot bigger and was bleeding today. No significant pain, patient is not on any blood thinners.  No active bleeding at this time.  HPI     Home Medications Prior to Admission medications   Medication Sig Start Date End Date Taking? Authorizing Provider  naproxen (NAPROSYN) 500 MG tablet Take 1 tablet (500 mg total) by mouth 2 (two) times daily. 04/26/21   Raspet, Erin K, PA-C  sildenafil (REVATIO) 20 MG tablet Take 1-3 tablets (20-60 mg total) by mouth daily as needed (erectile dysfunction). 04/17/22   Vivi Barrack, MD  tadalafil (CIALIS) 5 MG tablet Take 1 tablet (5 mg total) by mouth daily. 04/09/22   Vivi Barrack, MD      Allergies    Patient has no known allergies.    Review of Systems   Review of Systems  Physical Exam Updated Vital Signs BP (!) 146/84 (BP Location: Right Arm)   Pulse 81   Temp 98 F (36.7 C) (Oral)   Resp 16   SpO2 98%  Physical Exam Vitals and nursing note reviewed. Exam conducted with a chaperone present (Mali Grose).  Constitutional:      General: He is not in acute distress.    Appearance: He is well-developed. He is not diaphoretic.  HENT:     Head: Normocephalic and atraumatic.  Eyes:     General: No scleral icterus.    Conjunctiva/sclera: Conjunctivae normal.  Cardiovascular:     Rate and Rhythm: Normal rate and regular rhythm.     Heart sounds: Normal heart sounds.  Pulmonary:     Effort: Pulmonary effort is normal. No respiratory distress.     Breath sounds: Normal breath sounds.  Abdominal:     Palpations: Abdomen is soft.     Tenderness: There is no abdominal tenderness.  Genitourinary:    Penis:  Normal.      Comments: Well circumscribed mobile subcutaneous fluctuant mass with central ulceration, no active bleeding, no tenderness redness or streaking Musculoskeletal:     Cervical back: Normal range of motion and neck supple.  Skin:    General: Skin is warm and dry.  Neurological:     Mental Status: He is alert.  Psychiatric:        Behavior: Behavior normal.     ED Results / Procedures / Treatments   Labs (all labs ordered are listed, but only abnormal results are displayed) Labs Reviewed - No data to display  EKG None  Radiology US SCROTUM W/DOPPLER  Result Date: 09/29/2022 CLINICAL DATA:  Bleeding mass on scrotum. EXAM: SCROTAL ULTRASOUND DOPPLER ULTRASOUND OF THE TESTICLES TECHNIQUE: Complete ultrasound examination of the testicles, epididymis, and other scrotal structures was performed. Color and spectral Doppler ultrasound were also utilized to evaluate blood flow to the testicles. COMPARISON:  None Available. FINDINGS: Right testicle Measurements: 4.0 x 2.3 x 3.1 cm. Normal morphology. No abnormal color flow finding. Left testicle Measurements: 4.1 x 2.1 x 2.8 cm. Normal morphology. No abnormal color flow finding. Right epididymis:  Not clearly identified.  No regional mass. Left epididymis:  Normal in size and appearance.  Hydrocele:  None visualized. Varicocele:  None visualized. Pulsed Doppler interrogation of both testes demonstrates normal low resistance arterial and venous waveforms bilaterally. The area of the skin abnormality was studied sonographically, with a nonspecific appearance. No evidence of deep space extension. Mild edema and hyperemia. IMPRESSION: The area of the skin abnormality was studied sonographically, with a nonspecific appearance. No evidence of deep space extension. Mild edema and hyperemia in the region. Normal appearance of the testes. No evidence of mass or torsion. Electronically Signed   By: Nelson Chimes M.D.   On: 09/29/2022 10:53     Procedures Procedures    Medications Ordered in ED Medications - No data to display  ED Course/ Medical Decision Making/ A&P                           Medical Decision Making Amount and/or Complexity of Data Reviewed Radiology: ordered and independent interpretation performed.    Details: Visualized and reported ultrasound scrotum with Doppler and agree with radiologic interpretation  Risk Prescription drug management.   Patient here with scrotal mass, differential diagnosis includes epididymitis, orchitis, solid mass, lipoma, epidermoid cyst.  This appears to be most likely subcutaneous mass is likely sebaceous cyst.  There is no active bleeding or signs of infection.  Will discharge with doxycycline, referral to urology for excision.  He appears otherwise appropriate for discharge at this time.  Discussed return precautions.        Final Clinical Impression(s) / ED Diagnoses Final diagnoses:  None    Rx / DC Orders ED Discharge Orders          Ordered    doxycycline (VIBRAMYCIN) 100 MG capsule  2 times daily,   Status:  Discontinued        09/29/22 1532    doxycycline (VIBRAMYCIN) 100 MG capsule  2 times daily        09/29/22 1533              Margarita Mail, PA-C 09/29/22 1624    Carmin Muskrat, MD 09/30/22 [phone]

## 2022-10-02 ENCOUNTER — Encounter: Payer: Self-pay | Admitting: Family Medicine

## 2022-10-02 ENCOUNTER — Ambulatory Visit (INDEPENDENT_AMBULATORY_CARE_PROVIDER_SITE_OTHER): Payer: Commercial Managed Care - PPO | Admitting: Family Medicine

## 2022-10-02 VITALS — BP 133/78 | HR 71 | Temp 97.5°F | Ht 69.0 in | Wt 196.8 lb

## 2022-10-02 DIAGNOSIS — F524 Premature ejaculation: Secondary | ICD-10-CM

## 2022-10-02 DIAGNOSIS — E785 Hyperlipidemia, unspecified: Secondary | ICD-10-CM

## 2022-10-02 DIAGNOSIS — R739 Hyperglycemia, unspecified: Secondary | ICD-10-CM | POA: Diagnosis not present

## 2022-10-02 DIAGNOSIS — Z0001 Encounter for general adult medical examination with abnormal findings: Secondary | ICD-10-CM | POA: Diagnosis not present

## 2022-10-02 DIAGNOSIS — Z1159 Encounter for screening for other viral diseases: Secondary | ICD-10-CM

## 2022-10-02 DIAGNOSIS — N5089 Other specified disorders of the male genital organs: Secondary | ICD-10-CM

## 2022-10-02 LAB — COMPREHENSIVE METABOLIC PANEL
ALT: 13 U/L (ref 0–53)
AST: 20 U/L (ref 0–37)
Albumin: 4 g/dL (ref 3.5–5.2)
Alkaline Phosphatase: 63 U/L (ref 39–117)
BUN: 11 mg/dL (ref 6–23)
CO2: 31 mEq/L (ref 19–32)
Calcium: 9.4 mg/dL (ref 8.4–10.5)
Chloride: 103 mEq/L (ref 96–112)
Creatinine, Ser: 0.7 mg/dL (ref 0.40–1.50)
GFR: 104.72 mL/min (ref >60.00)
Glucose, Bld: 82 mg/dL (ref 70–99)
Potassium: 4.6 mEq/L (ref 3.5–5.1)
Sodium: 141 mEq/L (ref 135–145)
Total Bilirubin: 0.5 mg/dL (ref 0.2–1.2)
Total Protein: 6.6 g/dL (ref 6.0–8.3)

## 2022-10-02 LAB — CBC
HCT: 52.1 % — ABNORMAL HIGH (ref 39.0–52.0)
Hemoglobin: 17.6 g/dL — ABNORMAL HIGH (ref 13.0–17.0)
MCHC: 33.7 g/dL (ref 30.0–36.0)
MCV: 96.2 fl (ref 78.0–100.0)
Platelets: 171 10*3/uL (ref 150.0–400.0)
RBC: 5.41 Mil/uL (ref 4.22–5.81)
RDW: 14.9 % (ref 11.5–15.5)
WBC: 6.1 10*3/uL (ref 4.0–10.5)

## 2022-10-02 LAB — TSH: TSH: 0.85 u[IU]/mL (ref 0.35–5.50)

## 2022-10-02 LAB — HEMOGLOBIN A1C: Hgb A1c MFr Bld: 5.5 % (ref 4.6–6.5)

## 2022-10-02 LAB — LIPID PANEL
Cholesterol: 198 mg/dL (ref 0–200)
HDL: 60.2 mg/dL (ref >39.00)
LDL Cholesterol: 122 mg/dL — ABNORMAL HIGH (ref 0–99)
NonHDL: 137.38
Total CHOL/HDL Ratio: 3
Triglycerides: 76 mg/dL (ref 0.0–149.0)
VLDL: 15.2 mg/dL (ref 0.0–40.0)

## 2022-10-02 MED ORDER — SILDENAFIL CITRATE 20 MG PO TABS: 20.0000 mg | ORAL_TABLET | Freq: Every day | ORAL | 3 refills | Status: DC | PRN

## 2022-10-02 MED ORDER — SERTRALINE HCL 50 MG PO TABS: 50.0000 mg | ORAL_TABLET | Freq: Every day | ORAL | 3 refills | Status: DC

## 2022-10-02 NOTE — Patient Instructions (Signed)
It was very nice to see you today!  We will refer you to see the urologist.  We will check blood work today.  Please continue to work on diet and exercise.  I will refill your sildenafil and sertraline.  Please come back in year for your next physical.  Come back sooner if needed.  Take care, Dr [name]  PLEASE NOTE:  If you had any lab tests please let us know if you have not heard back within a few days. You may see your results on mychart before we have a chance to review them but we will give you a call once they are reviewed by [place]. If we ordered any referrals today, please let us know if you have not heard from their office within the next week.   Please try these tips to maintain a healthy lifestyle:  Eat at least 3 REAL meals and 1-2 snacks per day.  Aim for no more than 5 hours between eating.  If you eat breakfast, please do so within one hour of getting up.   Each meal should contain half fruits/vegetables, one quarter protein, and one quarter carbs (no bigger than a computer mouse)  Cut down on sweet beverages. This includes juice, soda, and sweet tea.   Drink at least 1 glass of water with each meal and aim for at least 8 glasses per day  Exercise at least 150 minutes every week.    Preventive Care 27-81 Years Old, Male Preventive care refers to lifestyle choices and visits with your health care provider that can promote health and wellness. Preventive care visits are also called wellness exams. What can I expect for my preventive care visit? Counseling During your preventive care visit, your health care provider may ask about your: Medical history, including: Past medical problems. Family medical history. Current health, including: Emotional well-being. Home life and relationship well-being. Sexual activity. Lifestyle, including: Alcohol, nicotine or tobacco, and drug use. Access to firearms. Diet, exercise, and sleep habits. Safety issues such as seatbelt and  bike helmet use. Sunscreen use. Work and work Astronomer. Physical exam Your health care provider will check your: Height and weight. These may be used to calculate your BMI (body mass index). BMI is a measurement that tells if you are at a healthy weight. Waist circumference. This measures the distance around your waistline. This measurement also tells if you are at a healthy weight and may help predict your risk of certain diseases, such as type 2 diabetes and high blood pressure. Heart rate and blood pressure. Body temperature. Skin for abnormal spots. What immunizations do I need?  Vaccines are usually given at various ages, according to a schedule. Your health care provider will recommend vaccines for you based on your age, medical history, and lifestyle or other factors, such as travel or where you work. What tests do I need? Screening Your health care provider may recommend screening tests for certain conditions. This may include: Lipid and cholesterol levels. Diabetes screening. This is done by checking your blood sugar (glucose) after you have not eaten for a while (fasting). Hepatitis B test. Hepatitis C test. HIV (human immunodeficiency virus) test. STI (sexually transmitted infection) testing, if you are at risk. Lung cancer screening. Prostate cancer screening. Colorectal cancer screening. Talk with your health care provider about your test results, treatment options, and if necessary, the need for more tests. Follow these instructions at home: Eating and drinking  Eat a diet that includes fresh fruits and vegetables,  whole grains, lean protein, and low-fat dairy products. Take vitamin and mineral supplements as recommended by your health care provider. Do not drink alcohol if your health care provider tells you not to drink. If you drink alcohol: Limit how much you have to 0-2 drinks a day. Know how much alcohol is in your drink. In the [place] one drink equals one 12  oz bottle of beer (355 mL), one 5 oz glass of wine (148 mL), or one 1 oz glass of hard liquor (44 mL). Lifestyle Brush your teeth every morning and night with fluoride toothpaste. Floss one time each day. Exercise for at least 30 minutes 5 or more days each week. Do not use any products that contain nicotine or tobacco. These products include cigarettes, chewing tobacco, and vaping devices, such as e-cigarettes. If you need help quitting, ask your health care provider. Do not use drugs. If you are sexually active, practice safe sex. Use a condom or other form of protection to prevent STIs. Take aspirin only as told by your health care provider. Make sure that you understand how much to take and what form to take. Work with your health care provider to find out whether it is safe and beneficial for you to take aspirin daily. Find healthy ways to manage stress, such as: Meditation, yoga, or listening to music. Journaling. Talking to a trusted person. Spending time with friends and family. Minimize exposure to UV radiation to reduce your risk of skin cancer. Safety Always wear your seat belt while driving or riding in a vehicle. Do not drive: If you have been drinking alcohol. Do not ride with someone who has been drinking. When you are tired or distracted. While texting. If you have been using any mind-altering substances or drugs. Wear a helmet and other protective equipment during sports activities. If you have firearms in your house, make sure you follow all gun safety procedures. What's next? Go to your health care provider once a year for an annual wellness visit. Ask your health care provider how often you should have your eyes and teeth checked. Stay up to date on all vaccines. This information is not intended to replace advice given to you by your health care provider. Make sure you discuss any questions you have with your health care provider. Document Revised: 05/08/2021 Document  Reviewed: 05/08/2021 Elsevier Patient Education  2023 [employer].

## 2022-10-02 NOTE — Progress Notes (Signed)
Chief Complaint:  Allen Savage. is a 56 y.o. male who presents today for his annual comprehensive physical exam.    Assessment/Plan:  New/Acute Problems: Scrotal Mass We will refer to urology.  Was evaluated in the ED for this recently.  Chronic Problems Addressed Today: Dyslipidemia Check labs.  Discussed lifestyle modifications.  Hyperglycemia Check A1c.  Premature ejaculation He would like to restart sertraline.  Did well with this in the past.  Will refill today.  We will also refill his sildenafil.  Cannot afford the Cialis.   Preventative Healthcare: Check labs.  Up-to-date on colon cancer screening.  Patient Counseling(The following topics were reviewed and/or handout was given):  -Nutrition: Stressed importance of moderation in sodium/caffeine intake, saturated fat and cholesterol, caloric balance, sufficient intake of fresh fruits, vegetables, and fiber.  -Stressed the importance of regular exercise.   -Substance Abuse: Discussed cessation/primary prevention of tobacco, alcohol, or other drug use; driving or other dangerous activities under the influence; availability of treatment for abuse.   -Injury prevention: Discussed safety belts, safety helmets, smoke detector, smoking near bedding or upholstery.   -Sexuality: Discussed sexually transmitted diseases, partner selection, use of condoms, avoidance of unintended pregnancy and contraceptive alternatives.   -Dental health: Discussed importance of regular tooth brushing, flossing, and dental visits.  -Health maintenance and immunizations reviewed. Please refer to Health maintenance section.  Return to care in 1 year for next preventative visit.     Subjective:  HPI:  He has no acute complaints today.   He was in the ED a few days ago with scrotal pain.  Underwent ultrasound showed to have sebaceous cyst.  Pain has been relatively manageable though still significant.  He has not yet scheduled with urology for  further evaluation.  Lifestyle Diet: Balanced. Trying to get plenty of fruits of vegetables.  Exercise: None specific.      10/02/2022    1:38 PM  Depression screen PHQ 2/9  Decreased Interest 0  Down, Depressed, Hopeless 0  PHQ - 2 Score 0    Health Maintenance Due  Topic Date Due   Hepatitis C Screening  Never done     ROS: Per HPI, otherwise a complete review of systems was negative.   PMH:  The following were reviewed and entered/updated in epic: Past Medical History:  Diagnosis Date   DVT (deep venous thrombosis) (HCC)    Hemorrhoids    exernal    History of pulmonary embolus (PE) 06/2018   from DVT in right calf   Patient Active Problem List   Diagnosis Date Noted   Lower urinary tract symptoms (LUTS) 09/30/2021   Hyperglycemia 08/15/2020   Chronic anticoagulation 08/25/2019   Dyslipidemia 08/12/2018   Deep vein thrombosis (DVT) of distal vein of lower extremity (HCC) 07/29/2018   Premature ejaculation 07/29/2018   History of pulmonary embolus (PE) 06/2018   Past Surgical History:  Procedure Laterality Date   SHOULDER SURGERY Bilateral     Family History  Problem Relation Age of Onset   Diabetes Father    Colon cancer Neg Hx    Esophageal cancer Neg Hx    Rectal cancer Neg Hx    Stomach cancer Neg Hx     Medications- reviewed and updated Current Outpatient Medications  Medication Sig Dispense Refill   naproxen (NAPROSYN) 500 MG tablet Take 1 tablet (500 mg total) by mouth 2 (two) times daily. 20 tablet 0   sertraline (ZOLOFT) 50 MG tablet Take 1 tablet (50 mg total)  by mouth daily. 30 tablet 3   sildenafil (REVATIO) 20 MG tablet Take 1-3 tablets (20-60 mg total) by mouth daily as needed (erectile dysfunction). 90 tablet 3   No current facility-administered medications for this visit.    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  Tobacco Use   Smoking status: Never   Smokeless tobacco: Never  Vaping Use   Vaping Use: Never used  Substance and Sexual Activity   Alcohol use: Yes    Alcohol/week: 1.0 standard drink of alcohol    Types: 1 Glasses of wine per week   Drug use: No   Sexual activity: Not on file  Other Topics Concern   Not on file  Social History Narrative   Not on file   Social Determinants of Health   Financial Resource Strain: Not on file  Food Insecurity: Not on file  Transportation Needs: Not on file  Physical Activity: Not on file  Stress: Not on file  Social Connections: Not on file        Objective:  Physical Exam: BP 133/78   Pulse 71   Temp (!) 97.5 F (36.4 C) (Temporal)   Ht 5\' 9"  (1.753 m)   Wt 196 lb 12.8 oz (89.3 kg)   SpO2 99%   BMI 29.06 kg/m   Body mass index is 29.06 kg/m. Wt Readings from Last 3 Encounters:  10/02/22 196 lb 12.8 oz (89.3 kg)  09/30/21 200 lb 4 oz (90.8 kg)  08/15/20 205 lb 9.6 oz (93.3 kg)   Gen: NAD, resting comfortably HEENT: TMs normal bilaterally. OP clear. No thyromegaly noted.  CV: RRR with no murmurs appreciated Pulm: NWOB, CTAB with no crackles, wheezes, or rhonchi GI: Normal bowel sounds present. Soft, Nontender, Nondistended. MSK: no edema, cyanosis, or clubbing noted Skin: warm, dry Neuro: CN2-12 grossly intact. Strength 5/5 in upper and lower extremities. Reflexes symmetric and intact bilaterally.  Psych: Normal affect and thought content     Madison Direnzo M. Jerline Pain, MD 10/02/2022 2:22 PM

## 2022-10-02 NOTE — Assessment & Plan Note (Signed)
Check labs.  Discussed lifestyle modifications. 

## 2022-10-02 NOTE — Assessment & Plan Note (Signed)
He would like to restart sertraline.  Did well with this in the past.  Will refill today.  We will also refill his sildenafil.  Cannot afford the Cialis.

## 2022-10-02 NOTE — Assessment & Plan Note (Signed)
Check A1c. 

## 2022-10-03 ENCOUNTER — Encounter: Payer: BLUE CROSS/BLUE SHIELD | Admitting: Family Medicine

## 2022-10-07 NOTE — Progress Notes (Signed)
Please inform patient of the following:  Lab are all stable compared to last year.  His cholesterol is borderline elevated but stable.  Do not need to make any changes to his treatment plan at this time.  He should continue to work on diet and exercise and we can recheck in a year.

## 2022-10-21 ENCOUNTER — Encounter: Payer: Self-pay | Admitting: Family Medicine

## 2023-02-07 ENCOUNTER — Other Ambulatory Visit: Payer: Self-pay | Admitting: Family Medicine

## 2023-03-24 ENCOUNTER — Other Ambulatory Visit: Payer: Self-pay

## 2023-03-24 ENCOUNTER — Emergency Department (HOSPITAL_COMMUNITY)
Admission: EM | Admit: 2023-03-24 | Discharge: 2023-03-25 | Disposition: A | Discharge status: Home / Self Care | Payer: Commercial Managed Care - PPO | Attending: Emergency Medicine | Admitting: Emergency Medicine

## 2023-03-24 DIAGNOSIS — I82461 Acute embolism and thrombosis of right calf muscular vein: Secondary | ICD-10-CM | POA: Insufficient documentation

## 2023-03-24 DIAGNOSIS — M79661 Pain in right lower leg: Secondary | ICD-10-CM | POA: Diagnosis present

## 2023-03-24 DIAGNOSIS — Z86718 Personal history of other venous thrombosis and embolism: Secondary | ICD-10-CM | POA: Diagnosis not present

## 2023-03-24 DIAGNOSIS — Z86711 Personal history of pulmonary embolism: Secondary | ICD-10-CM | POA: Insufficient documentation

## 2023-03-24 LAB — PROTIME-INR
INR: 1 (ref 0.8–1.2)
Prothrombin Time: 13.1 seconds (ref 11.4–15.2)

## 2023-03-24 NOTE — ED Triage Notes (Addendum)
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 Respirations unlabored.

## 2023-03-25 ENCOUNTER — Other Ambulatory Visit (HOSPITAL_COMMUNITY): Payer: Self-pay

## 2023-03-25 ENCOUNTER — Encounter (HOSPITAL_COMMUNITY): Payer: Self-pay

## 2023-03-25 ENCOUNTER — Ambulatory Visit (HOSPITAL_BASED_OUTPATIENT_CLINIC_OR_DEPARTMENT_OTHER)
Admission: RE | Admit: 2023-03-25 | Discharge: 2023-03-25 | Disposition: A | Discharge status: Home / Self Care | Payer: Commercial Managed Care - PPO | Source: Ambulatory Visit | Attending: Surgery | Admitting: Surgery

## 2023-03-25 ENCOUNTER — Ambulatory Visit (HOSPITAL_BASED_OUTPATIENT_CLINIC_OR_DEPARTMENT_OTHER)
Admission: RE | Admit: 2023-03-25 | Discharge: 2023-03-25 | Disposition: A | Discharge status: Home / Self Care | Payer: Commercial Managed Care - PPO | Source: Ambulatory Visit | Attending: Emergency Medicine | Admitting: Emergency Medicine

## 2023-03-25 VITALS — BP 138/89 | HR 66

## 2023-03-25 DIAGNOSIS — M79661 Pain in right lower leg: Secondary | ICD-10-CM

## 2023-03-25 DIAGNOSIS — I82461 Acute embolism and thrombosis of right calf muscular vein: Secondary | ICD-10-CM

## 2023-03-25 LAB — COMPREHENSIVE METABOLIC PANEL
ALT: 23 U/L (ref 0–44)
AST: 31 U/L (ref 15–41)
Albumin: 3.3 g/dL — ABNORMAL LOW (ref 3.5–5.0)
Alkaline Phosphatase: 74 U/L (ref 38–126)
Anion gap: 9 (ref 5–15)
BUN: 5 mg/dL — ABNORMAL LOW (ref 6–20)
CO2: 25 mmol/L (ref 22–32)
Calcium: 8.9 mg/dL (ref 8.9–10.3)
Chloride: 103 mmol/L (ref 98–111)
Creatinine, Ser: 0.86 mg/dL (ref 0.61–1.24)
GFR, Estimated: >60 mL/min (ref >60)
Glucose, Bld: 101 mg/dL — ABNORMAL HIGH (ref 70–99)
Potassium: 3.7 mmol/L (ref 3.5–5.1)
Sodium: 137 mmol/L (ref 135–145)
Total Bilirubin: 0.6 mg/dL (ref 0.3–1.2)
Total Protein: 6.5 g/dL (ref 6.5–8.1)

## 2023-03-25 LAB — CBC WITH DIFFERENTIAL/PLATELET
Abs Immature Granulocytes: 0.02 10*3/uL (ref 0.00–0.07)
Basophils Absolute: 0 10*3/uL (ref 0.0–0.1)
Basophils Relative: 0 %
Eosinophils Absolute: 0 10*3/uL (ref 0.0–0.5)
Eosinophils Relative: 0 %
HCT: 55.6 % — ABNORMAL HIGH (ref 39.0–52.0)
Hemoglobin: 18.7 g/dL — ABNORMAL HIGH (ref 13.0–17.0)
Immature Granulocytes: 0 %
Lymphocytes Relative: 34 %
Lymphs Abs: 2.7 10*3/uL (ref 0.7–4.0)
MCH: 32 pg (ref 26.0–34.0)
MCHC: 33.6 g/dL (ref 30.0–36.0)
MCV: 95.2 fL (ref 80.0–100.0)
Monocytes Absolute: 0.7 10*3/uL (ref 0.1–1.0)
Monocytes Relative: 10 %
Neutro Abs: 4.3 10*3/uL (ref 1.7–7.7)
Neutrophils Relative %: 56 %
Platelets: 239 10*3/uL (ref 150–400)
RBC: 5.84 MIL/uL — ABNORMAL HIGH (ref 4.22–5.81)
RDW: 13.6 % (ref 11.5–15.5)
WBC: 7.8 10*3/uL (ref 4.0–10.5)
nRBC: 0 % (ref 0.0–0.2)

## 2023-03-25 MED ORDER — ASPIRIN 81 MG PO TBEC: 81.0000 mg | DELAYED_RELEASE_TABLET | Freq: Every day | ORAL | 0 refills | Status: DC

## 2023-03-25 MED ORDER — ENOXAPARIN SODIUM 100 MG/ML IJ SOSY
90.0000 mg | PREFILLED_SYRINGE | Freq: Once | INTRAMUSCULAR | Status: AC
  Administered 2023-03-25: 90 mg via SUBCUTANEOUS
  Filled 2023-03-25: qty 0.9

## 2023-03-25 MED ORDER — IBUPROFEN 200 MG PO TABS: 200.0000 mg | ORAL_TABLET | Freq: Four times a day (QID) | ORAL | 0 refills | Status: AC | PRN

## 2023-03-25 NOTE — ED Provider Notes (Signed)
[place] EMERGENCY DEPARTMENT AT [facility] Provider Note   CSN: [id number] Arrival date & time: 03/24/23  2313     History  Chief Complaint  Patient presents with   Right Calf Pain     Hx. DVT / PE    Allen Pennella. is a 57 y.o. male with previous medical history significant for DVT, PE who is not currently on anticoagulation who presents with concern for right calf pain, questionable mild swelling for the last several days.  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  Patient reports that it feels similar to the last time he had a blood clot.  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  HPI     Home Medications Prior to Admission medications   Medication Sig Start Date End Date Taking? Authorizing Provider  naproxen (NAPROSYN) 500 MG tablet Take 1 tablet (500 mg total) by mouth 2 (two) times daily. 04/26/21   Raspet, Noberto Retort, PA-C  sertraline (ZOLOFT) 50 MG tablet TAKE 1 TABLET(50 MG) BY MOUTH DAILY 02/09/23   Ardith Dark, MD  sildenafil (REVATIO) 20 MG tablet Take 1-3 tablets (20-60 mg total) by mouth daily as needed (erectile dysfunction). 10/02/22   Ardith Dark, MD      Allergies    Patient has no known allergies.    Review of Systems   Review of Systems  Musculoskeletal:  Positive for myalgias.  All other systems reviewed and are negative.   Physical Exam Updated Vital Signs BP (!) 152/94 (BP Location: Right Arm)   Pulse 91   Temp (!) 97.5 F (36.4 C)   Resp 18   SpO2 97%  Physical Exam Vitals and nursing note reviewed.  Constitutional:      General: He is not in acute distress.    Appearance: Normal appearance.  HENT:     Head: Normocephalic and atraumatic.  Eyes:     General:        Right eye: No discharge.        Left eye: No discharge.  Cardiovascular:     Rate and Rhythm: Normal rate and regular rhythm.  Pulmonary:     Effort: Pulmonary effort is  normal. No respiratory distress.  Musculoskeletal:        General: No deformity.     Comments: I do not appreciate significant discrepancy in the size or swelling of right calf compared to left.  Patient endorses some mild tenderness to palpation of the right calf no palpable step-off, deformity.  Normal range of motion of bilateral ankles.  Patient can ambulate without difficulty.  Skin:    General: Skin is warm and dry.  Neurological:     Mental Status: He is alert and oriented to person, place, and time.  Psychiatric:        Mood and Affect: Mood normal.        Behavior: Behavior normal.     ED Results / Procedures / Treatments   Labs (all labs ordered are listed, but only abnormal results are displayed) Labs Reviewed  CBC WITH DIFFERENTIAL/PLATELET - Abnormal; Notable for the following components:      Result Value   RBC 5.84 (*)    Hemoglobin 18.7 (*)    HCT 55.6 (*)    All other components within normal limits  COMPREHENSIVE METABOLIC PANEL - Abnormal; Notable for the following components:   Glucose, Bld  101 (*)    BUN 5 (*)    Albumin 3.3 (*)    All other components within normal limits  PROTIME-INR    EKG None  Radiology No results found.  Procedures Procedures    Medications Ordered in ED Medications  enoxaparin (LOVENOX) injection 90 mg (90 mg Subcutaneous Given 03/25/23 0305)    ED Course/ Medical Decision Making/ A&P                             Medical Decision Making Amount and/or Complexity of Data Reviewed Labs: ordered.  Risk Prescription drug management.   This patient is a 57 y.o. male who presents to the ED for concern of right leg, calf cramping, pain.   Differential diagnoses prior to evaluation: DVT, developing PE without respiratory distress, patient musculoskeletal pain, versus other  Past Medical History / Social History / Additional history: Chart reviewed. Pertinent results include: Previous DVT, PE, not currently taking  anticoagulation  Physical Exam: Physical exam performed. The pertinent findings include: : I do not appreciate significant discrepancy in the size or swelling of right calf compared to left.  Patient endorses some mild tenderness to palpation of the right calf no palpable step-off, deformity.  Normal range of motion of bilateral ankles.  Patient can ambulate without difficulty.   Medications / Treatment: Patient given Lovenox injection and instructions to return for ultrasound in the morning, he is not having any respiratory distress and has stable oxygen saturation on room air, discussed with patient that even if he did have a blood clot that had moved to his lungs that this treatment would be appropriate, and we can extend to place him on Eliquis and discussed follow-up if ultrasound is positive in the morning.   Disposition: After consideration of the diagnostic results and the patients response to treatment, I feel that patient with musculoskeletal pain versus possible DVT of right lower extremity, vascular ultrasound not present at this time, will discharge after Lovenox injection for follow-up ultrasound in the morning, no signs of PE at this time, no respiratory distress, no pleuritic chest pain, stable oxygen saturation on room air.  No hemoptysis.Marland Kitchen   emergency department workup does not suggest an emergent condition requiring admission or immediate intervention beyond what has been performed at this time. The plan is: as above. The patient is safe for discharge and has been instructed to return immediately for worsening symptoms, change in symptoms or any other concerns.  Final Clinical Impression(s) / ED Diagnoses Final diagnoses:  Right calf pain    Rx / DC Orders ED Discharge Orders          Ordered    LE VENOUS        03/25/23 0235              Olene Floss, PA-C 03/25/23 5409    Sabas Sous, MD 03/25/23 [phone]

## 2023-03-25 NOTE — Progress Notes (Addendum)
DVT Clinic Note  Name: Allen Savage.     MRN: [id number]     DOB: 08-19-1966     Sex: male  PCP: Ardith Dark, MD  Today's Visit: Visit Information: Initial Visit  Referred to DVT Clinic by: Luther Hearing, PA-C [facility] ED)  Referred to CPP by: Dr. Myra Gianotti Reason for referral:  Chief Complaint  Patient presents with   Muscular vein thrombosis   HISTORY OF PRESENT ILLNESS:  Allen Luedke. is a 57 y.o. male who presents after diagnosis of calf muscle vein thrombosis for medication management. 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 Partial hypercoagulable workup at that time was negative. Multiple follow up ultrasounds demonstrated chronic nonocclusive thrombus of the R popliteal vein. Patient followed with Dr. Arbie Cookey of vascular surgery, and Eliquis was discontinued after over a year of treatment. Patient presented to the ED 03/24/23 with new right calf pain and concern he had a new DVT. Ultrasound today showed acute thrombus in the right gastrocnemius vein and was referred to DVT Clinic for medication management. Denies CP, SOB, palpitations, leg swelling. Primary complaint is right calf tenderness and pain.   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) Bleeding Risk Factors: None Present  Negative Thrombotic Risk Factors: Smoking, Obesity, Older age, Active cancer, Known thrombophilic condition, Non-malignant, chronic inflammatory condition, Erythropoiesis-stimulating agent, Within 6 weeks postpartum, Recent COVID diagnosis (within 3 months), Recent cesarean section (within 3 months), Estrogen therapy, Testosterone therapy, Pregnancy, Recent trauma (within 3 months), Recent surgery (within 3 months), Recent admission to hospital with acute illness (within 3 months), Paralysis,  paresis, or recent plaster cast immobilization of lower extremity, Central venous catheterization, Bed rest >72 hours within 3 months, Sedentary journey lasting >8 hours within 4 weeks  Rx Insurance Coverage: Commercial Rx Affordability: Aspirin 81 mg is 89 cents over the counter  Preferred Pharmacy: Patient picked up aspirin at [facility] over the counter immediately after today's visit   Past Medical History:  Diagnosis Date   DVT (deep venous thrombosis) (HCC)    Hemorrhoids    exernal    History of pulmonary embolus (PE) 06/2018   from DVT in right calf    Past Surgical History:  Procedure Laterality Date   SHOULDER SURGERY Bilateral     Social History   Socioeconomic History   Marital status: Significant Other    Spouse name: Not on file   Number of children: 2   Years of education: Not on file   Highest education level: Not on file  Occupational History   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  Tobacco Use   Smoking status: Never   Smokeless tobacco: Never  Vaping Use   Vaping Use: Never used  Substance and Sexual Activity   Alcohol use: Yes    Alcohol/week: 1.0 standard drink of alcohol    Types: 1 Glasses of wine per week   Drug use: No   Sexual activity: Not on file  Other Topics Concern   Not on file  Social History Narrative   Not on file   Social Determinants of Health   Financial Resource Strain: Not on file  Food Insecurity: Not on file  Transportation Needs: Not on file  Physical Activity: Not on file  Stress: Not  on file  Social Connections: Not on file  Intimate Partner Violence: Not on file    Family History  Problem Relation Age of Onset   Diabetes Father    Colon cancer Neg Hx    Esophageal cancer Neg Hx    Rectal cancer Neg Hx    Stomach cancer Neg Hx     Allergies as of 03/25/2023   (No Known Allergies)    Current Outpatient Medications on File Prior to Encounter  Medication Sig Dispense Refill   sertraline  (ZOLOFT) 50 MG tablet TAKE 1 TABLET(50 MG) BY MOUTH DAILY 30 tablet 3   No current facility-administered medications on file prior to encounter.   REVIEW OF SYSTEMS:  Review of Systems  Respiratory:  Negative for shortness of breath.   Cardiovascular:  Negative for chest pain, palpitations and leg swelling.  Musculoskeletal:  Positive for myalgias.  Neurological:  Negative for dizziness and tingling.   PHYSICAL EXAMINATION:  Vitals:   03/25/23 1207  BP: 138/89  Pulse: 66  SpO2: 100%   Physical Exam Vitals reviewed.  Cardiovascular:     Rate and Rhythm: Normal rate.  Pulmonary:     Effort: Pulmonary effort is normal.  Musculoskeletal:        General: Tenderness present. No swelling.  Skin:    Findings: No bruising or erythema.  Psychiatric:        Mood and Affect: Mood normal.        Behavior: Behavior normal.        Thought Content: Thought content normal.   Villalta Score for Post-Thrombotic Syndrome: Pain: Moderate Cramps: Moderate Heaviness: Severe Paresthesia: Mild Pruritus: Absent Pretibial Edema: Absent Skin Induration: Absent Hyperpigmentation: Absent Redness: Absent Venous Ectasia: Absent Pain on calf compression: Mild Villalta Preliminary Score: 9 Is venous ulcer present?: No If venous ulcer is present and score is <15, then 15 points total are assigned: Absent Villalta Total Score: 9  LABS:  CBC     Component Value Date/Time   WBC 7.8 03/24/2023 2331   RBC 5.84 (H) 03/24/2023 2331   HGB 18.7 (H) 03/24/2023 2331   HCT 55.6 (H) 03/24/2023 2331   PLT 239 03/24/2023 2331   MCV 95.2 03/24/2023 2331   MCH 32.0 03/24/2023 2331   MCHC 33.6 03/24/2023 2331   RDW 13.6 03/24/2023 2331   LYMPHSABS 2.7 03/24/2023 2331   MONOABS 0.7 03/24/2023 2331   EOSABS 0.0 03/24/2023 2331   BASOSABS 0.0 03/24/2023 2331    Hepatic Function      Component Value Date/Time   PROT 6.5 03/24/2023 2331   ALBUMIN 3.3 (L) 03/24/2023 2331   AST 31 03/24/2023 2331   ALT  23 03/24/2023 2331   ALKPHOS 74 03/24/2023 2331   BILITOT 0.6 03/24/2023 2331    Renal Function   Lab Results  Component Value Date   CREATININE 0.86 03/24/2023   CREATININE 0.70 10/02/2022   CREATININE 0.85 08/15/2022    CrCl cannot be calculated (Unknown ideal weight.).   VVS Vascular Lab Studies:  03/25/23 VAS [place] LOWER EXTREMITY VENOUS (DVT)  Summary:  RIGHT:  - Findings consistent with acute deep vein thrombosis involving the right  gastrocnemius veins.  - Findings consistent with chronic deep vein thrombosis involving the  right popliteal vein.  - No cystic structure found in the popliteal fossa.    LEFT:  - No evidence of common femoral vein obstruction.   ASSESSMENT: Location of DVT:  (Right muscular vein thrombus)  Patient with history of DVT/PE in  2019. Chronic right popliteal vein thrombosis seen today on imaging is consistent with prior imaging from 2020, but doppler today does show new gastrocnemius vein thrombosis. Discussed patient with Dr. Myra Gianotti and will treat calf muscle vein thrombosis with low dose aspirin for 3 months with ibuprofen and warm compresses as needed. If symptoms worsen or become more proximal within the next 1-2 weeks, the patient will reach out, and we would repeat ultrasound to rule out clot propagation at that time.   PLAN: -Start aspirin 81 mg once daily for 3 months, ibuprofen 200-400 mg q6-8 hours as needed, warm compresses. -Expected duration of therapy: 3 months. Therapy started on 03/25/23. -Patient educated on purpose, proper use and potential adverse effects of aspirin and ibuprofen. -Discussed importance of taking medication around the same time every day. -Advised patient of medications to avoid (NSAIDs, aspirin doses >100 mg daily). -Educated that Tylenol (acetaminophen) is the preferred analgesic to lower the risk of bleeding. -Advised patient to alert all providers of anticoagulation therapy prior to starting a new medication or  having a procedure. -Emphasized importance of monitoring for signs and symptoms of bleeding (abnormal bruising, prolonged bleeding, nose bleeds, bleeding from gums, discolored urine, black tarry stools). -Educated patient to present to the ED if emergent signs and symptoms of new thrombosis occur.  Follow up: Patient instructed to call if symptoms worsen or become more proximal within the next 1-2 weeks. If that occurred, would recheck [place] to rule out clot propagation at that time.   Pervis Hocking, PharmD, [place], CPP Deep Vein Thrombosis Clinic Clinical Pharmacist Practitioner Office: [phone]  I have evaluated the patient's chart/imaging and refer this patient to the Clinical Pharmacist Practitioner for medication management. I have reviewed the CPP's documentation and agree with her assessment and plan. I was immediately available during the visit for questions and collaboration.   Durene Cal, MD

## 2023-03-25 NOTE — Patient Instructions (Signed)
-  Start aspirin 81 mg daily for 3 months -Use ibuprofen 200-400 mg every 6-8 hours as needed.  -Warm compresses to the leg -It is important to take your medication around the same time every day.  -Avoid NSAIDs like ibuprofen (Advil, Motrin) and naproxen (Aleve) as well as aspirin doses over 100 mg daily. -Tylenol (acetaminophen) is the preferred over the counter pain medication to lower the risk of bleeding. -Be sure to alert all of your health care providers that you are taking an anticoagulant prior to starting a new medication or having a procedure. -Monitor for signs and symptoms of bleeding (abnormal bruising, prolonged bleeding, nose bleeds, bleeding from gums, discolored urine, black tarry stools). If you have fallen and hit your head OR if your bleeding is severe or not stopping, seek emergency care.  -Go to the emergency room if emergent signs and symptoms of new clot occur (new or worse swelling and pain in an arm or leg, shortness of breath, chest pain, fast or irregular heartbeats, lightheadedness, dizziness, fainting, coughing up blood) or if you experience a significant color change (pale or blue) in the extremity that has the DVT.  -We recommend you wear compression stockings as long as you are having swelling or pain. Be sure to purchase the correct size and take them off at night.   Call me if your pain is getting worse or traveling higher up the leg over the next 1-2 weeks.   If you have any questions or need to reschedule an appointment, please call [phone] [facility].  If you are having an emergency, call 911 or present to the nearest emergency room.   What is a DVT?  -Deep vein thrombosis (DVT) is a condition in which a blood clot forms in a vein of the deep venous system which can occur in the lower leg, thigh, pelvis, arm, or neck. This condition is serious and can be life-threatening if the clot travels to the arteries of the lungs and causing a blockage (pulmonary  embolism, PE). A DVT can also damage veins in the leg, which can lead to long-term venous disease, leg pain, swelling, discoloration, and ulcers or sores (post-thrombotic syndrome).  -Treatment may include taking an anticoagulant medication to prevent more clots from forming and the current clot from growing, wearing compression stockings, and/or surgical procedures to remove or dissolve the clot.

## 2023-03-25 NOTE — Discharge Instructions (Addendum)
Please use Tylenol or ibuprofen for pain.  You may use 600 mg ibuprofen every 6 hours or 1000 mg of Tylenol every 6 hours.  You may choose to alternate between the 2.  This would be most effective.  Not to exceed 4 g of Tylenol within 24 hours.  Not to exceed 3200 mg ibuprofen 24 hours.  Please return in the morning for [place]. If you return for an ultrasound which shows a blood clot you will need to begin a blood thinner medication, for the time being the medication that I gave in the emergency department would treat any blood clots that you currently have, you are not in any respiratory distress, not showing signs or symptoms of having a blood clot in the lungs requiring hospital admission, please return with instructions as above for ultrasound in the morning to definitively rule out blood clot

## 2023-03-26 ENCOUNTER — Telehealth: Payer: Self-pay | Admitting: Family Medicine

## 2023-03-26 NOTE — Telephone Encounter (Signed)
Prescription Request  03/26/2023  LOV: 10/02/2022  What is the name of the medication or equipment?  sertraline (ZOLOFT) 50 MG tablet   Have you contacted your pharmacy to request a refill? No   Which pharmacy would you like this sent to?  [id number] - [place], [place] - [address] AT [facility] [address] [place] [address] Phone: [phone] Fax: [phone]    Patient notified that their request is being sent to the clinical staff for review and that they should receive a response within 2 business days.   Please advise at Mobile [phone] (mobile)

## 2023-03-27 ENCOUNTER — Telehealth: Payer: Self-pay | Admitting: Family Medicine

## 2023-03-27 ENCOUNTER — Other Ambulatory Visit: Payer: Self-pay | Admitting: *Deleted

## 2023-03-27 MED ORDER — SERTRALINE HCL 50 MG PO TABS: ORAL_TABLET | ORAL | 3 refills | Status: DC

## 2023-03-27 NOTE — Telephone Encounter (Signed)
Rx send to [facility]  °

## 2023-03-27 NOTE — Telephone Encounter (Signed)
Patient states: Allen Savage to ED on 4/30 for right calf pain; found to have blood clot  - Currently still working an experiencing pain - Would like note for work to be excused for a couple of days   Pt is scheduled for ED follow up on 5/6 @ 10am.

## 2023-03-27 NOTE — Telephone Encounter (Signed)
See note patient has a F/U appt on 03/30/2023

## 2023-03-30 ENCOUNTER — Encounter: Payer: Self-pay | Admitting: Family Medicine

## 2023-03-30 ENCOUNTER — Ambulatory Visit: Payer: Commercial Managed Care - PPO | Admitting: Family Medicine

## 2023-03-30 VITALS — BP 132/76 | HR 94 | Temp 97.3°F | Ht 69.0 in | Wt 193.0 lb

## 2023-03-30 DIAGNOSIS — I825Z1 Chronic embolism and thrombosis of unspecified deep veins of right distal lower extremity: Secondary | ICD-10-CM

## 2023-03-30 MED ORDER — RIVAROXABAN (XARELTO) VTE STARTER PACK (15 & 20 MG): ORAL_TABLET | ORAL | 0 refills | Status: DC

## 2023-03-30 MED ORDER — RIVAROXABAN 20 MG PO TABS
20.0000 mg | ORAL_TABLET | Freq: Every day | ORAL | 3 refills | Status: DC
Start: 2023-03-30 — End: 2024-04-06

## 2023-03-30 NOTE — Patient Instructions (Addendum)
It was very nice to see you today!  Please start the Xarelto.  Will give you a work excuse.   Please let us know if you have any side effects or signs of bleeding.  We should keep you on a blood thinner indefinitely going forward to prevent this from happening again.  Return in about 3 months (around 06/30/2023) for Follow Up.   Take care, Dr [name]  PLEASE NOTE:  If you had any lab tests, please let us know if you have not heard back within a few days. You may see your results on mychart before we have a chance to review them but we will give you a call once they are reviewed by [place].   If we ordered any referrals today, please let us know if you have not heard from their office within the next week.   If you had any urgent prescriptions sent in today, please check with the pharmacy within an hour of our visit to make sure the prescription was transmitted appropriately.   Please try these tips to maintain a healthy lifestyle:  Eat at least 3 REAL meals and 1-2 snacks per day.  Aim for no more than 5 hours between eating.  If you eat breakfast, please do so within one hour of getting up.   Each meal should contain half fruits/vegetables, one quarter protein, and one quarter carbs (no bigger than a computer mouse)  Cut down on sweet beverages. This includes juice, soda, and sweet tea.   Drink at least 1 glass of water with each meal and aim for at least 8 glasses per day  Exercise at least 150 minutes every week.

## 2023-03-30 NOTE — Assessment & Plan Note (Signed)
Still has quite a bit of pain in his right calf.  Thankfully no signs of PE.  He needs to be on anticoagulation - was only given a single dose of lovenox in the ED last week.  We will start Xarelto.  Discussed with patient that he needs to be on anticoagulation lifelong at this point due to recurrence.  Discussed potential side effects.  He will let us know if he has any signs of bleeding.  Will follow-up in 3 months. Will give work note for the next couple of weeks.

## 2023-03-30 NOTE — Progress Notes (Signed)
   Allen Savage. is a 57 y.o. male who presents today for an office visit.  Assessment/Plan:  Chronic Problems Addressed Today: Deep vein thrombosis (DVT) of distal vein of lower extremity (HCC) Still has quite a bit of pain in his right calf.  Thankfully no signs of PE.  He needs to be on anticoagulation - was only given a single dose of lovenox in the ED last week.  We will start Xarelto.  Discussed with patient that he needs to be on anticoagulation lifelong at this point due to recurrence.  Discussed potential side effects.  He will let us know if he has any signs of bleeding.  Will follow-up in 3 months. Will give work note for the next couple of weeks.      Subjective:  HPI:  See A/P for status of chronic conditions.  Patient is here for ED follow-up.  Went to the ED a week ago with right calf pain.  There was concern for DVT.  Was started on Lovenox and told to come back for lower extremity ultrasound.  He had this done the next day which confirmed DVT.  He was told to take ibuprofen and aspirin.  He was not started on any blood thinners after getting the ultrasound result.  Still has persistent pain in his right calf.  He did try to work for a day however this causes significant discomfort and he has not been to work since.  Works as a city Midwife.  No chest pain or shortness of breath.  No signs of bleeding.        Objective:  Physical Exam: BP 132/76   Pulse 94   Temp (!) 97.3 F (36.3 C) (Temporal)   Ht 5\' 9"  (1.753 m)   Wt 193 lb (87.5 kg)   SpO2 99%   BMI 28.50 kg/m   Gen: No acute distress, resting comfortably CV: Regular rate and rhythm with no murmurs appreciated Pulm: Normal work of breathing, clear to auscultation bilaterally with no crackles, wheezes, or rhonchi Neuro: Grossly normal, moves all extremities Psych: Normal affect and thought content  Time Spent: 40 minutes of total time was spent on the date of the encounter performing the following actions:  chart review prior to seeing the patient including recent ED visit, obtaining history, performing a medically necessary exam, counseling on the treatment plan, placing orders, and documenting in our EHR.        Katina Degree. [name], MD 03/30/2023 10:33 AM

## 2023-04-06 ENCOUNTER — Telehealth: Payer: Self-pay | Admitting: Family Medicine

## 2023-04-06 NOTE — Telephone Encounter (Signed)
Patient dropped off document  Short-Term Disability , to be filled out by provider. Patient requested to send it via Fax within ASAP. Document is located in providers tray at front office.Please advise

## 2023-04-06 NOTE — Telephone Encounter (Signed)
Place in PCP office to be reviewed  

## 2023-04-08 ENCOUNTER — Telehealth: Payer: Self-pay | Admitting: Family Medicine

## 2023-04-08 NOTE — Telephone Encounter (Signed)
Form placed in PCP office to be revised

## 2023-04-08 NOTE — Telephone Encounter (Signed)
Pt wants to add, if you can add time out til the end of this month on the paperwork. Please advise.

## 2023-04-08 NOTE — Telephone Encounter (Signed)
See note

## 2023-04-08 NOTE — Telephone Encounter (Signed)
Patient dropped off document FMLA, to be filled out by provider. Patient requested to send it via Call Patient to pick up . Document is located in providers tray at front office.Please advise at Mobile [phone]

## 2023-04-08 NOTE — Telephone Encounter (Signed)
Patient requests note for work to be excused be extended at least until the end of May

## 2023-04-09 NOTE — Telephone Encounter (Signed)
Se note  

## 2023-04-10 DIAGNOSIS — Z0279 Encounter for issue of other medical certificate: Secondary | ICD-10-CM

## 2023-04-10 NOTE — Telephone Encounter (Signed)
Form completed and given to Francena Hanly to fax over.  Katina Degree. [name], MD 04/10/2023 2:03 PM

## 2023-04-13 NOTE — Telephone Encounter (Signed)
Pt called again about paperwork

## 2023-04-13 NOTE — Telephone Encounter (Signed)
Form Faxed on 04/10/2023 Fax #[phone] Placed to be scan in pt chart

## 2023-04-13 NOTE — Telephone Encounter (Signed)
Faxed on 05/107/2024 Placed to be scan in pt chart Mail copy to pt

## 2023-04-13 NOTE — Telephone Encounter (Signed)
Patient notified form was faxed on 04/10/2023 Copy at front office to be pick up

## 2023-04-27 NOTE — Telephone Encounter (Signed)
Patient states he failed to realize that note stating he is able to return to work today. Patient states currently on his way to pick up note.

## 2023-04-30 ENCOUNTER — Encounter: Payer: Self-pay | Admitting: Family Medicine

## 2023-04-30 ENCOUNTER — Ambulatory Visit: Payer: Commercial Managed Care - PPO | Admitting: Family Medicine

## 2023-04-30 VITALS — BP 121/77 | HR 81 | Temp 97.7°F | Ht 69.0 in | Wt 194.0 lb

## 2023-04-30 DIAGNOSIS — I825Z1 Chronic embolism and thrombosis of unspecified deep veins of right distal lower extremity: Secondary | ICD-10-CM

## 2023-04-30 DIAGNOSIS — F524 Premature ejaculation: Secondary | ICD-10-CM

## 2023-04-30 LAB — CBC WITH DIFFERENTIAL/PLATELET
Basophils Absolute: 0 10*3/uL (ref 0.0–0.1)
Basophils Relative: 0.4 % (ref 0.0–3.0)
Eosinophils Absolute: 0 10*3/uL (ref 0.0–0.7)
Eosinophils Relative: 0.6 % (ref 0.0–5.0)
HCT: 53.7 % — ABNORMAL HIGH (ref 39.0–52.0)
Hemoglobin: 17.9 g/dL — ABNORMAL HIGH (ref 13.0–17.0)
Lymphocytes Relative: 21.5 % (ref 12.0–46.0)
Lymphs Abs: 1.4 10*3/uL (ref 0.7–4.0)
MCHC: 33.3 g/dL (ref 30.0–36.0)
MCV: 96.7 fl (ref 78.0–100.0)
Monocytes Absolute: 0.9 10*3/uL (ref 0.1–1.0)
Monocytes Relative: 14 % — ABNORMAL HIGH (ref 3.0–12.0)
Neutro Abs: 4 10*3/uL (ref 1.4–7.7)
Neutrophils Relative %: 63.5 % (ref 43.0–77.0)
Platelets: 197 10*3/uL (ref 150.0–400.0)
RBC: 5.56 Mil/uL (ref 4.22–5.81)
RDW: 14.4 % (ref 11.5–15.5)
WBC: 6.3 10*3/uL (ref 4.0–10.5)

## 2023-04-30 LAB — COMPREHENSIVE METABOLIC PANEL
ALT: 18 U/L (ref 0–53)
AST: 28 U/L (ref 0–37)
Albumin: 3.9 g/dL (ref 3.5–5.2)
Alkaline Phosphatase: 68 U/L (ref 39–117)
BUN: 11 mg/dL (ref 6–23)
CO2: 29 mEq/L (ref 19–32)
Calcium: 9.2 mg/dL (ref 8.4–10.5)
Chloride: 101 mEq/L (ref 96–112)
Creatinine, Ser: 0.9 mg/dL (ref 0.40–1.50)
GFR: 96.68 mL/min (ref >60.00)
Glucose, Bld: 93 mg/dL (ref 70–99)
Potassium: 4.7 mEq/L (ref 3.5–5.1)
Sodium: 140 mEq/L (ref 135–145)
Total Bilirubin: 0.7 mg/dL (ref 0.2–1.2)
Total Protein: 6.6 g/dL (ref 6.0–8.3)

## 2023-04-30 MED ORDER — SERTRALINE HCL 50 MG PO TABS: ORAL_TABLET | ORAL | 3 refills | Status: DC

## 2023-04-30 NOTE — Progress Notes (Signed)
Labs are all stable.  We can recheck again in a couple of months.

## 2023-04-30 NOTE — Assessment & Plan Note (Signed)
Still has some pain in his right calf but does seem to be improving.  He is now on Xarelto 20 mg daily and tolerating well.  No significant side effects.  We did discuss that he would need to be on this lifelong due to recurrence of DVT.  We will check CBC and c-Met today.  It is okay for him to return to work in 1 to 2 weeks assuming symptoms continue to improve.  Follow-up again in a couple of months.

## 2023-04-30 NOTE — Progress Notes (Signed)
Addendum to previous note:  His hemoglobin is stable compared to previous over the last few years however his baseline is on the higher side.  This is probably nothing to worry about however it would be a good idea for [place] to have him see a hematologist to make sure there is nothing else that is going on and also to make sure that this did not contribute to his blood clots.  Please place referral to hematology if he is agreeable.

## 2023-04-30 NOTE — Progress Notes (Signed)
   Allen Savage. is a 57 y.o. male who presents today for an office visit.  Assessment/Plan:  Chronic Problems Addressed Today: Deep vein thrombosis (DVT) of distal vein of lower extremity (HCC) Still has some pain in his right calf but does seem to be improving.  He is now on Xarelto 20 mg daily and tolerating well.  No significant side effects.  We did discuss that he would need to be on this lifelong due to recurrence of DVT.  We will check CBC and c-Met today.  It is okay for him to return to work in 1 to 2 weeks assuming symptoms continue to improve.  Follow-up again in a couple of months.  Premature ejaculation Stable on sertraline 50 mg daily.  Will refill today.  Tolerating well.  No significant side effects.    Subjective:  HPI:  See Assessment / plan for status of chronic conditions. He last was here about a month ago for DVT.  This was diagnosed in the ED however they did not start anticoagulation.  We started Xarelto.  He has done well with this.  He did okay with initial titration and is now on 20 mg once daily.  He still has occasional pain and swelling in his calf but does seem to be improving.  No significant side effects with Xarelto.        Objective:  Physical Exam: BP 121/77   Pulse 81   Temp 97.7 F (36.5 C) (Temporal)   Ht 5\' 9"  (1.753 m)   Wt 194 lb (88 kg)   SpO2 99%   BMI 28.65 kg/m   Gen: No acute distress, resting comfortably Neuro: Grossly normal, moves all extremities Psych: Normal affect and thought content      Anadia Helmes M. [name], MD 04/30/2023 11:13 AM

## 2023-04-30 NOTE — Patient Instructions (Signed)
It was very nice to see you today!  We will check blood work today.  I will refill your sertraline.  Please let us know if your symptoms do not continue to improve or if you have any side effects with Xarelto.  Return in about 3 months (around 07/31/2023).   Take care, Dr [name]  PLEASE NOTE:  If you had any lab tests, please let us know if you have not heard back within a few days. You may see your results on mychart before we have a chance to review them but we will give you a call once they are reviewed by [place].   If we ordered any referrals today, please let us know if you have not heard from their office within the next week.   If you had any urgent prescriptions sent in today, please check with the pharmacy within an hour of our visit to make sure the prescription was transmitted appropriately.   Please try these tips to maintain a healthy lifestyle:  Eat at least 3 REAL meals and 1-2 snacks per day.  Aim for no more than 5 hours between eating.  If you eat breakfast, please do so within one hour of getting up.   Each meal should contain half fruits/vegetables, one quarter protein, and one quarter carbs (no bigger than a computer mouse)  Cut down on sweet beverages. This includes juice, soda, and sweet tea.   Drink at least 1 glass of water with each meal and aim for at least 8 glasses per day  Exercise at least 150 minutes every week.

## 2023-04-30 NOTE — Assessment & Plan Note (Signed)
Stable on sertraline 50 mg daily.  Will refill today.  Tolerating well.  No significant side effects.

## 2023-06-30 ENCOUNTER — Encounter: Payer: Self-pay | Admitting: Family Medicine

## 2023-06-30 ENCOUNTER — Ambulatory Visit: Payer: Commercial Managed Care - PPO | Admitting: Family Medicine

## 2023-06-30 VITALS — BP 128/74 | HR 64 | Temp 97.7°F | Ht 69.0 in | Wt 191.4 lb

## 2023-06-30 DIAGNOSIS — D582 Other hemoglobinopathies: Secondary | ICD-10-CM

## 2023-06-30 DIAGNOSIS — I825Z1 Chronic embolism and thrombosis of unspecified deep veins of right distal lower extremity: Secondary | ICD-10-CM | POA: Diagnosis not present

## 2023-06-30 DIAGNOSIS — F524 Premature ejaculation: Secondary | ICD-10-CM | POA: Diagnosis not present

## 2023-06-30 LAB — COMPREHENSIVE METABOLIC PANEL
ALT: 22 U/L (ref 0–53)
AST: 48 U/L — ABNORMAL HIGH (ref 0–37)
Albumin: 3.8 g/dL (ref 3.5–5.2)
Alkaline Phosphatase: 56 U/L (ref 39–117)
BUN: 9 mg/dL (ref 6–23)
CO2: 25 mEq/L (ref 19–32)
Calcium: 9.2 mg/dL (ref 8.4–10.5)
Chloride: 105 mEq/L (ref 96–112)
Creatinine, Ser: 0.77 mg/dL (ref 0.40–1.50)
GFR: 101.22 mL/min (ref >60.00)
Glucose, Bld: 84 mg/dL (ref 70–99)
Potassium: 4.3 mEq/L (ref 3.5–5.1)
Sodium: 140 mEq/L (ref 135–145)
Total Bilirubin: 0.8 mg/dL (ref 0.2–1.2)
Total Protein: 6.5 g/dL (ref 6.0–8.3)

## 2023-06-30 LAB — CBC WITH DIFFERENTIAL/PLATELET
Basophils Absolute: 0 10*3/uL (ref 0.0–0.1)
Basophils Relative: 0.5 % (ref 0.0–3.0)
Eosinophils Absolute: 0 10*3/uL (ref 0.0–0.7)
Eosinophils Relative: 0.5 % (ref 0.0–5.0)
HCT: 49.1 % (ref 39.0–52.0)
Hemoglobin: 16.1 g/dL (ref 13.0–17.0)
Lymphocytes Relative: 28.4 % (ref 12.0–46.0)
Lymphs Abs: 1.1 10*3/uL (ref 0.7–4.0)
MCHC: 32.8 g/dL (ref 30.0–36.0)
MCV: 98.2 fl (ref 78.0–100.0)
Monocytes Absolute: 0.5 10*3/uL (ref 0.1–1.0)
Monocytes Relative: 13.3 % — ABNORMAL HIGH (ref 3.0–12.0)
Neutro Abs: 2.2 10*3/uL (ref 1.4–7.7)
Neutrophils Relative %: 57.3 % (ref 43.0–77.0)
Platelets: 190 10*3/uL (ref 150.0–400.0)
RBC: 5 Mil/uL (ref 4.22–5.81)
RDW: 15.9 % — ABNORMAL HIGH (ref 11.5–15.5)
WBC: 3.9 10*3/uL — ABNORMAL LOW (ref 4.0–10.5)

## 2023-06-30 LAB — IBC + FERRITIN
Ferritin: 182.2 ng/mL (ref 22.0–322.0)
Iron: 105 ug/dL (ref 42–165)
Saturation Ratios: 36.1 % (ref 20.0–50.0)
TIBC: 291.2 ug/dL (ref 250.0–450.0)
Transferrin: 208 mg/dL — ABNORMAL LOW (ref 212.0–360.0)

## 2023-06-30 MED ORDER — SERTRALINE HCL 50 MG PO TABS: ORAL_TABLET | ORAL | 3 refills | Status: DC

## 2023-06-30 NOTE — Patient Instructions (Signed)
It was very nice to see you today!  We will check blood work today.  I am glad that you are doing well.  We will see you back in a few months for your annual physical.  Come back sooner if needed.  Return in about 3 months (around 09/30/2023) for Annual Physical.   Take care, Dr [name]  PLEASE NOTE:  If you had any lab tests, please let us know if you have not heard back within a few days. You may see your results on mychart before we have a chance to review them but we will give you a call once they are reviewed by [place].   If we ordered any referrals today, please let us know if you have not heard from their office within the next week.   If you had any urgent prescriptions sent in today, please check with the pharmacy within an hour of our visit to make sure the prescription was transmitted appropriately.   Please try these tips to maintain a healthy lifestyle:  Eat at least 3 REAL meals and 1-2 snacks per day.  Aim for no more than 5 hours between eating.  If you eat breakfast, please do so within one hour of getting up.   Each meal should contain half fruits/vegetables, one quarter protein, and one quarter carbs (no bigger than a computer mouse)  Cut down on sweet beverages. This includes juice, soda, and sweet tea.   Drink at least 1 glass of water with each meal and aim for at least 8 glasses per day  Exercise at least 150 minutes every week.

## 2023-06-30 NOTE — Assessment & Plan Note (Signed)
Found on labs from a few months ago.  We had recommended referral to hematology however patient was not able to follow-up.  Looking back at his last couple of years it does appear that his baseline over the last couple of years has been in the 17-18 range with a high of 18.7.  Given his recurrence of DVT would be reasonable to pursue further workup for this at this point to rule out myeloproliferative disorder.  Discussed referral to hematology though will start preliminary workup.  Will check labs including CBC, c-Met, iron panel, peripheral smear, EPO and Jak2 genotype.

## 2023-06-30 NOTE — Progress Notes (Signed)
   Allen Savage. is a 57 y.o. male who presents today for an office visit.  Assessment/Plan:  Chronic Problems Addressed Today: Deep vein thrombosis (DVT) of distal vein of lower extremity (HCC) Doing well on Xarelto 20 mg daily.  Needs to be on lifelong anticoagulation due to recurrence of DVT.  Check labs today.  He will let us know if he runs into any issue with the medication.  Premature ejaculation Stable on Zoloft 50 mg daily.  Will refill today.  Elevated hemoglobin (HCC) Found on labs from a few months ago.  We had recommended referral to hematology however patient was not able to follow-up.  Looking back at his last couple of years it does appear that his baseline over the last couple of years has been in the 17-18 range with a high of 18.7.  Given his recurrence of DVT would be reasonable to pursue further workup for this at this point to rule out myeloproliferative disorder.  Discussed referral to hematology though will start preliminary workup.  Will check labs including CBC, c-Met, iron panel, peripheral smear, EPO and Jak2 genotype.      Subjective:  HPI:  See A/P for status of chronic conditions.  Patient is here today for follow-up.  He was last seen here a couple of months ago.  At that time we had continued him on Xarelto 20 mg daily for his recurrent DVT.  We did discuss that he would need to be on this lifelong due to recurrence.  At our last visit he was incidentally found to have elevated hemoglobin.  We had called to discuss his results however he did not return call.       Objective:  Physical Exam: BP 128/74   Pulse 64   Temp 97.7 F (36.5 C) (Temporal)   Ht 5\' 9"  (1.753 m)   Wt 191 lb 6.4 oz (86.8 kg)   SpO2 100%   BMI 28.26 kg/m   Gen: No acute distress, resting comfortably CV: Regular rate and rhythm with no murmurs appreciated Pulm: Normal work of breathing, clear to auscultation bilaterally with no crackles, wheezes, or rhonchi Neuro: Grossly  normal, moves all extremities Psych: Normal affect and thought content       M. [name], MD 06/30/2023 11:19 AM

## 2023-06-30 NOTE — Assessment & Plan Note (Signed)
Doing well on Xarelto 20 mg daily.  Needs to be on lifelong anticoagulation due to recurrence of DVT.  Check labs today.  He will let us know if he runs into any issue with the medication.

## 2023-06-30 NOTE — Assessment & Plan Note (Signed)
Stable on Zoloft 50 mg daily.  Will refill today.

## 2023-07-06 NOTE — Progress Notes (Signed)
Labs are all stable.  No need to do any other testing at this point.  We can recheck in 6 to 12 months.

## 2023-10-01 ENCOUNTER — Telehealth: Payer: Self-pay | Admitting: Family Medicine

## 2023-10-01 NOTE — Telephone Encounter (Signed)
Patient dropped off document FMLA, to be filled out by provider. Patient requested to send it back via Fax [phone] 5-days. Document is located in providers tray at front office.Please advise at Mobile [phone] (mobile)

## 2023-10-05 ENCOUNTER — Encounter: Payer: Commercial Managed Care - PPO | Admitting: Family Medicine

## 2023-10-06 ENCOUNTER — Other Ambulatory Visit: Payer: Self-pay | Admitting: *Deleted

## 2023-10-06 NOTE — Telephone Encounter (Signed)
FMLA form placed to be rewired in PCP office

## 2023-10-12 DIAGNOSIS — Z0279 Encounter for issue of other medical certificate: Secondary | ICD-10-CM

## 2023-10-13 NOTE — Telephone Encounter (Signed)
FMLA Faxed tp [phone] [phone] as pt recommendation  Form mail to patient  Copy placed to be scan in patient chart

## 2023-12-18 ENCOUNTER — Other Ambulatory Visit: Payer: Self-pay | Admitting: Family Medicine

## 2023-12-18 MED ORDER — SERTRALINE HCL 50 MG PO TABS: ORAL_TABLET | ORAL | 3 refills | Status: DC

## 2023-12-18 NOTE — Telephone Encounter (Signed)
Copied from CRM [id number]. Topic: Clinical - Medication Refill >> Dec 18, 2023  1:32 PM Irine Seal wrote: Most Recent Primary Care Visit:  Provider: Ardith Dark  Department: [facility]  Visit Type: OFFICE VISIT  Date: 06/30/2023  Medication: sertraline (ZOLOFT) 50 MG tablet  Has the patient contacted their pharmacy? Yes (Agent: If no, request that the patient contact the pharmacy for the refill. If patient does not wish to contact the pharmacy document the reason why and proceed with request.) (Agent: If yes, when and what did the pharmacy advise?) pharmacy stated it was too early to refill, per the pharmacy notes it states:  " ZERO refills remain on this prescription. Your patient is requesting advance approval of refills for this medication to PREVENT ANY MISSED DOSES"  Is this the correct pharmacy for this prescription? Yes If no, delete pharmacy and type the correct one.  This is the patient's preferred pharmacy:  [facility] [address], [place] - [address] AT [facility] [address] [place] [address] Phone: [phone] Fax: [phone]   Has the prescription been filled recently? Yes  Is the patient out of the medication? Almost   Has the patient been seen for an appointment in the last year OR does the patient have an upcoming appointment? Yes  Can we respond through MyChart? Yes  Agent: Please be advised that Rx refills may take up to 3 business days. We ask that you follow-up with your pharmacy.

## 2024-02-14 ENCOUNTER — Other Ambulatory Visit: Payer: Self-pay | Admitting: Family Medicine

## 2024-04-06 ENCOUNTER — Other Ambulatory Visit: Payer: Self-pay | Admitting: *Deleted

## 2024-04-06 MED ORDER — RIVAROXABAN 20 MG PO TABS: 20.0000 mg | ORAL_TABLET | Freq: Every day | ORAL | 1 refills | Status: DC

## 2024-04-25 ENCOUNTER — Other Ambulatory Visit: Payer: Self-pay | Admitting: *Deleted

## 2024-04-25 MED ORDER — SERTRALINE HCL 50 MG PO TABS: ORAL_TABLET | ORAL | 0 refills | Status: DC

## 2024-06-03 ENCOUNTER — Telehealth: Payer: Self-pay | Admitting: Family Medicine

## 2024-06-03 NOTE — Telephone Encounter (Signed)
 Patient dropped off document FMLA, to be filled out by provider. Patient requested to send it back via Fax within 5-days. Document is located in providers tray at front office.Please advise at Mobile [phone] (mobile)

## 2024-06-06 NOTE — Telephone Encounter (Signed)
 LVM to return call.

## 2024-06-08 NOTE — Telephone Encounter (Signed)
 We have not seen patient since 06/2023

## 2024-06-10 NOTE — Telephone Encounter (Signed)
 Please contact pt and advise will need to schedule OV to discuss with PCP

## 2024-06-17 ENCOUNTER — Ambulatory Visit: Admitting: Family Medicine

## 2024-06-17 ENCOUNTER — Encounter: Payer: Self-pay | Admitting: Family Medicine

## 2024-06-17 ENCOUNTER — Telehealth: Payer: Self-pay

## 2024-06-17 ENCOUNTER — Ambulatory Visit: Payer: Self-pay | Admitting: Family Medicine

## 2024-06-17 VITALS — BP 129/83 | HR 91 | Temp 97.7°F | Ht 69.0 in | Wt 183.4 lb

## 2024-06-17 DIAGNOSIS — Z131 Encounter for screening for diabetes mellitus: Secondary | ICD-10-CM | POA: Diagnosis not present

## 2024-06-17 DIAGNOSIS — I825Z1 Chronic embolism and thrombosis of unspecified deep veins of right distal lower extremity: Secondary | ICD-10-CM

## 2024-06-17 DIAGNOSIS — D582 Other hemoglobinopathies: Secondary | ICD-10-CM

## 2024-06-17 DIAGNOSIS — R600 Localized edema: Secondary | ICD-10-CM | POA: Insufficient documentation

## 2024-06-17 DIAGNOSIS — Z13220 Encounter for screening for lipoid disorders: Secondary | ICD-10-CM

## 2024-06-17 DIAGNOSIS — Z0279 Encounter for issue of other medical certificate: Secondary | ICD-10-CM

## 2024-06-17 LAB — COMPREHENSIVE METABOLIC PANEL WITH GFR
ALT: 14 U/L (ref 0–53)
AST: 25 U/L (ref 0–37)
Albumin: 4.1 g/dL (ref 3.5–5.2)
Alkaline Phosphatase: 63 U/L (ref 39–117)
BUN: 11 mg/dL (ref 6–23)
CO2: 25 meq/L (ref 19–32)
Calcium: 9.2 mg/dL (ref 8.4–10.5)
Chloride: 103 meq/L (ref 96–112)
Creatinine, Ser: 0.8 mg/dL (ref 0.40–1.50)
GFR: 99.38 mL/min (ref >60.00)
Glucose, Bld: 83 mg/dL (ref 70–99)
Potassium: 4.2 meq/L (ref 3.5–5.1)
Sodium: 139 meq/L (ref 135–145)
Total Bilirubin: 0.6 mg/dL (ref 0.2–1.2)
Total Protein: 7.1 g/dL (ref 6.0–8.3)

## 2024-06-17 LAB — LIPID PANEL
Cholesterol: 186 mg/dL (ref 0–200)
HDL: 55.6 mg/dL (ref >39.00)
LDL Cholesterol: 121 mg/dL — ABNORMAL HIGH (ref 0–99)
NonHDL: 130.28
Total CHOL/HDL Ratio: 3
Triglycerides: 45 mg/dL (ref 0.0–149.0)
VLDL: 9 mg/dL (ref 0.0–40.0)

## 2024-06-17 LAB — CBC
HCT: 55.5 % — ABNORMAL HIGH (ref 39.0–52.0)
Hemoglobin: 18.5 g/dL (ref 13.0–17.0)
MCHC: 33.3 g/dL (ref 30.0–36.0)
MCV: 97.6 fl (ref 78.0–100.0)
Platelets: 198 K/uL (ref 150.0–400.0)
RBC: 5.69 Mil/uL (ref 4.22–5.81)
RDW: 15.2 % (ref 11.5–15.5)
WBC: 5.4 K/uL (ref 4.0–10.5)

## 2024-06-17 LAB — HEMOGLOBIN A1C: Hgb A1c MFr Bld: 5.3 % (ref 4.6–6.5)

## 2024-06-17 LAB — TSH: TSH: 0.65 u[IU]/mL (ref 0.35–5.50)

## 2024-06-17 MED ORDER — SERTRALINE HCL 50 MG PO TABS: ORAL_TABLET | ORAL | 3 refills | Status: AC

## 2024-06-17 MED ORDER — RIVAROXABAN 20 MG PO TABS: 20.0000 mg | ORAL_TABLET | Freq: Every day | ORAL | 3 refills | Status: AC

## 2024-06-17 NOTE — Patient Instructions (Signed)
 It was very nice to see you today!  We will refill your medications today.  Will check blood work.  The swelling in your legs is probably due to venous insufficiency.  We will check an ultrasound.  Please try to keep your legs elevated and avoid sodium and salt.  You can also try using compression stockings.  Return in about 1 year (around 06/17/2025) for Annual Physical.   Take care, Dr Kennyth  PLEASE NOTE:  If you had any lab tests, please let us  know if you have not heard back within a few days. You may see your results on mychart before we have a chance to review them but we will give you a call once they are reviewed by us .   If we ordered any referrals today, please let us  know if you have not heard from their office within the next week.   If you had any urgent prescriptions sent in today, please check with the pharmacy within an hour of our visit to make sure the prescription was transmitted appropriately.   Please try these tips to maintain a healthy lifestyle:  Eat at least 3 REAL meals and 1-2 snacks per day.  Aim for no more than 5 hours between eating.  If you eat breakfast, please do so within one hour of getting up.   Each meal should contain half fruits/vegetables, one quarter protein, and one quarter carbs (no bigger than a computer mouse)  Cut down on sweet beverages. This includes juice, soda, and sweet tea.   Drink at least 1 glass of water with each meal and aim for at least 8 glasses per day  Exercise at least 150 minutes every week.

## 2024-06-17 NOTE — Assessment & Plan Note (Signed)
 He is doing well with Xarelto  20 mg daily.  Needs to be on lifelong anticoagulation due to recurrence.  Check labs today.  Will complete his FMLA paperwork.

## 2024-06-17 NOTE — Telephone Encounter (Signed)
 Spoke with karen with [place] lab stated pt had hemoglobin of 18.5 notified dr kennyth verbally stated he would review and advise.

## 2024-06-17 NOTE — Assessment & Plan Note (Signed)
 Patient with trace to 1+ pitting edema today.  Likely secondary to venous insufficiency.  Does have history of DVT as well though he has been consistent with this Xarelto .  We did discuss conservative measures including leg elevation, compression stockings, and salt avoidance.  No red flag signs or symptoms.  Will check labs today and also check ultrasound today though overall suspicion for recurrent DVT is low at this point given that he has been consistent with his Xarelto .

## 2024-06-17 NOTE — Assessment & Plan Note (Signed)
 Check labs

## 2024-06-17 NOTE — Progress Notes (Signed)
   Allen L Landress Jr. is a 58 y.o. male who presents today for an office visit.  Assessment/Plan:  Chronic Problems Addressed Today: Deep vein thrombosis (DVT) of distal vein of lower extremity (HCC) He is doing well with Xarelto  20 mg daily.  Needs to be on lifelong anticoagulation due to recurrence.  Check labs today.  Will complete his FMLA paperwork.    Elevated hemoglobin (HCC) Check labs.   Leg edema Patient with trace to 1+ pitting edema today.  Likely secondary to venous insufficiency.  Does have history of DVT as well though he has been consistent with this Xarelto .  We did discuss conservative measures including leg elevation, compression stockings, and salt avoidance.  No red flag signs or symptoms.  Will check labs today and also check ultrasound today though overall suspicion for recurrent DVT is low at this point given that he has been consistent with his Xarelto .     Subjective:  HPI:  Patient here today for follow-up.  He is doing well today.  No acute concerns.  Last saw him almost a year ago.  He has history of DVTs and has been compliant with his Xarelto .  He has noticed increased puffiness and swelling in both of his legs the last several months.  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  No reported chest pain or shortness of breath.       Objective:  Physical Exam: BP 129/83   Pulse 91   Temp 97.7 F (36.5 C) (Temporal)   Ht 5' 9 (1.753 m)   Wt 183 lb 6.4 oz (83.2 kg)   SpO2 98%   BMI 27.08 kg/m   Gen: No acute distress, resting comfortably CV: Regular rate and rhythm with no murmurs appreciated Pulm: Normal work of breathing, clear to auscultation bilaterally with no crackles, wheezes, or rhonchi MUSCULOSKELETAL: - Legs: Trace to 1+ pitting edema to midshin bilaterally. Neuro: Grossly normal, moves all extremities Psych: Normal affect and thought content      Allen Savage M. Kennyth, MD 06/17/2024 11:43 AM

## 2024-06-17 NOTE — Progress Notes (Signed)
 Is hemoglobin is elevated.  He has been borderline high in the past but it was normal when we rechecked it last year.  This is probably nothing to worry about however it is probably a good idea for him to see a hematologist to further evaluate.  Please place referral if he is agreeable.  The rest of his labs are all stable and we can recheck again in a year.

## 2024-06-22 NOTE — Telephone Encounter (Signed)
 Copied from CRM [id number]. Topic: General - Other >> Jun 22, 2024  2:55 PM Franky GRADE wrote: Reason for CRM: Patient is returning a call he received. I advised of the hemoglobin level being high, patient understood and agrees to seeing the hematologist.   See results note  Mekhia Brogan,RMA

## 2024-06-23 ENCOUNTER — Ambulatory Visit (HOSPITAL_COMMUNITY)
Admission: RE | Admit: 2024-06-23 | Discharge: 2024-06-23 | Disposition: A | Discharge status: Home / Self Care | Source: Ambulatory Visit | Attending: Family Medicine | Admitting: Family Medicine

## 2024-06-23 DIAGNOSIS — I825Z1 Chronic embolism and thrombosis of unspecified deep veins of right distal lower extremity: Secondary | ICD-10-CM | POA: Diagnosis present

## 2024-06-23 NOTE — Progress Notes (Signed)
 Ultrasound is negative for blood clots.

## 2024-08-04 ENCOUNTER — Other Ambulatory Visit: Payer: Self-pay | Admitting: Nurse Practitioner

## 2024-08-04 DIAGNOSIS — D582 Other hemoglobinopathies: Secondary | ICD-10-CM

## 2024-08-04 NOTE — Progress Notes (Deleted)
 New Hematology/Oncology Consult   Requesting MD: Dr. Worth Kitty  [phone]      Reason for Consult: Elevated hemoglobin  HPI: [name] is a 58 year old man referred for evaluation of an elevated hemoglobin.  Review of prior CBCs in the EMR show hemoglobin has been high normal to elevated for several years.  On 08/07/2007  hemoglobin was 17.7; 07/29/2015 hemoglobin 18.7; 07/20/2018 17.1; 08/10/2018 16.4; 09/23/2018 16.4; 08/12/2019 hemoglobin 16.8; 09/07/2019 hemoglobin 18.3; 10/26/2019 hemoglobin 21.9; 10/27/2019 hemoglobin 17.9; 08/15/2020 hemoglobin 18.1; 09/30/2021 hemoglobin 17.9; 08/15/2022 hemoglobin 18.9; 10/02/2022 hemoglobin 17.6; 02/25/2023 hemoglobin 18.7; 04/30/2023 hemoglobin 17.9; 06/30/2023 hemoglobin 16.1; 06/17/2024 hemoglobin 18.5.  Additional labs 06/30/2023-negative for JAK2 V16F mutation; erythropoietin  normal 16.8.  He has a history of right lower extremity DVT and pulmonary embolus August 2019.  He saw Dr. Zhao on 09/01/2018 with recommendation for lifelong anticoagulation.    Past Medical History:  Diagnosis Date   DVT (deep venous thrombosis) (HCC)    Hemorrhoids    exernal    History of pulmonary embolus (PE) 06/2018   from DVT in right calf     Past Surgical History:  Procedure Laterality Date   SHOULDER SURGERY Bilateral      Current Outpatient Medications:    ibuprofen  (ADVIL ) 200 MG tablet, Take 1 tablet (200 mg total) by mouth every 6 (six) hours as needed for mild pain or moderate pain., Disp: 30 tablet, Rfl: 0   rivaroxaban  (XARELTO ) 20 MG TABS tablet, Take 1 tablet (20 mg total) by mouth daily with supper., Disp: 90 tablet, Rfl: 3   sertraline  (ZOLOFT ) 50 MG tablet, TAKE 1 TABLET(50 MG) BY MOUTH DAILY, Disp: 90 tablet, Rfl: 3:  :  No Known Allergies:  FH:  SOCIAL HISTORY:  Review of Systems:  Physical Exam:  There were no vitals taken for this visit.  HEENT: *** Lungs: *** Cardiac: *** Abdomen: *** GU: ***  Vascular: *** Lymph nodes:  *** Neurologic: *** Skin: *** Musculoskeletal: ***  LABS:  No results for input(s): WBC, HGB, HCT, PLT in the last 72 hours.  No results for input(s): NA, K, CL, CO2, GLUCOSE, BUN, CREATININE, CALCIUM in the last 72 hours.    RADIOLOGY:  No results found.  Assessment and Plan:   ***    Olam Ned, NP 08/04/2024, 4:29 PM

## 2024-08-05 ENCOUNTER — Telehealth: Payer: Self-pay | Admitting: *Deleted

## 2024-08-05 ENCOUNTER — Inpatient Hospital Stay: Attending: Nurse Practitioner | Admitting: Nurse Practitioner

## 2024-08-05 ENCOUNTER — Inpatient Hospital Stay

## 2024-08-05 NOTE — Telephone Encounter (Signed)
 Allen Savage was 30 minutes late for new patient appointment today. Unable to connect with him via telephone.

## 2024-09-09 ENCOUNTER — Ambulatory Visit: Admitting: Family Medicine

## 2024-09-09 VITALS — BP 138/82 | HR 97 | Temp 97.4°F | Wt 180.8 lb

## 2024-09-09 DIAGNOSIS — R059 Cough, unspecified: Secondary | ICD-10-CM

## 2024-09-09 LAB — POCT INFLUENZA A/B
Influenza A, POC: NEGATIVE
Influenza B, POC: NEGATIVE

## 2024-09-09 LAB — POC COVID19 BINAXNOW: SARS Coronavirus 2 Ag: NEGATIVE

## 2024-09-09 MED ORDER — AZITHROMYCIN 250 MG PO TABS: ORAL_TABLET | ORAL | 0 refills | Status: AC

## 2024-09-09 MED ORDER — AZELASTINE HCL 0.1 % NA SOLN: 2.0000 | Freq: Two times a day (BID) | NASAL | 12 refills | Status: AC

## 2024-09-09 NOTE — Progress Notes (Signed)
   Allen L Shell Jr. is a 58 y.o. male who presents today for an office visit.  Assessment/Plan:  Cough  No red flags.  COVID and flu test negative.  Likely has a viral URI.  He can use over-the-counter meds as needed.  Will start Astelin nasal spray.  Will send pocket prescription for azithromycin with instruction to not start unless symptoms worsen or fail to improve over the next several days.  Encouraged hydration.  Anticipate he will improve over the next few days.  We discussed reasons to return to care.  Follow-up as needed.    Subjective:  HPI:  See assessment / plan for status of chronic conditions.      Discussed the use of AI scribe software for clinical note transcription with the patient, who gave verbal consent to proceed.  History of Present Illness Allen Savage. is a 58 year old male who presents with upper respiratory symptoms for one week.  He has been experiencing nasal congestion, night sweats, chills, and a hard cough with sputum production for about a week. His throat feels a little scratchy, but there are no issues with his ears. He denies fever and has not checked his temperature at home.  He has taken zinc tablets, which he feels have helped slow down the progression of his symptoms. He mentions exposure to similar symptoms at work.           Objective:  Physical Exam: BP 138/82   Pulse 97   Temp (!) 97.4 F (36.3 C) (Temporal)   Wt 180 lb 12.8 oz (82 kg)   SpO2 98%   BMI 26.70 kg/m   Gen: No acute distress, resting comfortably HEENT: TMs clear.  OP erythematous.  Nasal mucosa erythematous and boggy bilaterally with copious amount of clear discharge CV: Regular rate and rhythm with no murmurs appreciated Pulm: Normal work of breathing, clear to auscultation bilaterally with no crackles, wheezes, or rhonchi Neuro: Grossly normal, moves all extremities Psych: Normal affect and thought content      Allen Savage M. Kennyth, MD 09/09/2024 11:34 AM

## 2024-09-09 NOTE — Patient Instructions (Signed)
 It was very nice to see you today!  VISIT SUMMARY: You came in today with upper respiratory symptoms that have been bothering you for about a week. We confirmed that you do not have COVID-19 or the flu.  YOUR PLAN: ACUTE UPPER RESPIRATORY INFECTION WITH COUGH: You have an upper respiratory infection that is causing nasal congestion, night sweats, chills, and a hard cough with sputum. -Continue taking zinc tablets as they seem to help. -Start using Astelin nasal spray twice daily. -If your symptoms worsen or do not improve, we will prescribe an antibiotic. -You declined cough medication. -You can return to work on Monday.  Return if symptoms worsen or fail to improve.   Take care, Dr Kennyth  PLEASE NOTE:  If you had any lab tests, please let us  know if you have not heard back within a few days. You may see your results on mychart before we have a chance to review them but we will give you a call once they are reviewed by us .   If we ordered any referrals today, please let us  know if you have not heard from their office within the next week.   If you had any urgent prescriptions sent in today, please check with the pharmacy within an hour of our visit to make sure the prescription was transmitted appropriately.   Please try these tips to maintain a healthy lifestyle:  Eat at least 3 REAL meals and 1-2 snacks per day.  Aim for no more than 5 hours between eating.  If you eat breakfast, please do so within one hour of getting up.   Each meal should contain half fruits/vegetables, one quarter protein, and one quarter carbs (no bigger than a computer mouse)  Cut down on sweet beverages. This includes juice, soda, and sweet tea.   Drink at least 1 glass of water with each meal and aim for at least 8 glasses per day  Exercise at least 150 minutes every week.
